# Patient Record
Sex: Female | Born: 1970
Health system: Southern US, Community
[De-identification: ages and names within clinical notes are randomized; demographics above are authoritative.]

## PROBLEM LIST (undated history)

## (undated) DIAGNOSIS — M069 Rheumatoid arthritis, unspecified: Secondary | ICD-10-CM

## (undated) HISTORY — DX: Rheumatoid arthritis, unspecified: M06.9

## (undated) HISTORY — PX: OTHER SURGICAL HISTORY: SHX169

---

## 2015-09-29 ENCOUNTER — Other Ambulatory Visit: Payer: Self-pay | Admitting: Internal Medicine

## 2015-09-29 DIAGNOSIS — Z1239 Encounter for other screening for malignant neoplasm of breast: Secondary | ICD-10-CM

## 2015-10-15 ENCOUNTER — Ambulatory Visit
Admission: RE | Admit: 2015-10-15 | Discharge: 2015-10-15 | Disposition: A | Discharge status: Home / Self Care | Payer: BLUE CROSS/BLUE SHIELD | Source: Ambulatory Visit | Attending: Internal Medicine | Admitting: Internal Medicine

## 2015-10-15 DIAGNOSIS — Z1239 Encounter for other screening for malignant neoplasm of breast: Secondary | ICD-10-CM

## 2015-10-15 DIAGNOSIS — Z1231 Encounter for screening mammogram for malignant neoplasm of breast: Secondary | ICD-10-CM | POA: Insufficient documentation

## 2018-03-22 ENCOUNTER — Other Ambulatory Visit (INDEPENDENT_AMBULATORY_CARE_PROVIDER_SITE_OTHER): Payer: BLUE CROSS/BLUE SHIELD

## 2018-03-22 ENCOUNTER — Ambulatory Visit (INDEPENDENT_AMBULATORY_CARE_PROVIDER_SITE_OTHER): Payer: BLUE CROSS/BLUE SHIELD | Admitting: Obstetrics and Gynecology

## 2018-03-22 ENCOUNTER — Encounter: Payer: Self-pay | Admitting: Obstetrics and Gynecology

## 2018-03-22 VITALS — BP 118/76 | HR 79 | Ht 64.0 in | Wt 137.5 lb

## 2018-03-22 DIAGNOSIS — Z3A01 Less than 8 weeks gestation of pregnancy: Secondary | ICD-10-CM | POA: Diagnosis not present

## 2018-03-22 DIAGNOSIS — Z3201 Encounter for pregnancy test, result positive: Secondary | ICD-10-CM

## 2018-03-22 DIAGNOSIS — N8312 Corpus luteum cyst of left ovary: Secondary | ICD-10-CM

## 2018-03-22 DIAGNOSIS — O200 Threatened abortion: Secondary | ICD-10-CM

## 2018-03-22 DIAGNOSIS — O3411 Maternal care for benign tumor of corpus uteri, first trimester: Secondary | ICD-10-CM | POA: Diagnosis not present

## 2018-03-22 DIAGNOSIS — O209 Hemorrhage in early pregnancy, unspecified: Secondary | ICD-10-CM | POA: Diagnosis not present

## 2018-03-22 DIAGNOSIS — O09521 Supervision of elderly multigravida, first trimester: Secondary | ICD-10-CM

## 2018-03-22 LAB — POCT URINALYSIS DIPSTICK
Bilirubin, UA: NEGATIVE
Glucose, UA: NEGATIVE
KETONES UA: NEGATIVE
LEUKOCYTES UA: NEGATIVE
NITRITE UA: NEGATIVE
PH UA: 6.5 (ref 5.0–8.0)
PROTEIN UA: POSITIVE — AB
Spec Grav, UA: 1.015 (ref 1.010–1.025)
UROBILINOGEN UA: 0.2 U/dL

## 2018-03-22 LAB — POCT URINE PREGNANCY: PREG TEST UR: POSITIVE — AB

## 2018-03-22 NOTE — Patient Instructions (Signed)
Threatened Miscarriage °A threatened miscarriage occurs when you have vaginal bleeding during your first 20 weeks of pregnancy but the pregnancy has not ended. If you have vaginal bleeding during this time, your health care provider will do tests to make sure you are still pregnant. If the tests show you are still pregnant and the developing baby (fetus) inside your womb (uterus) is still growing, your condition is considered a threatened miscarriage. °A threatened miscarriage does not mean your pregnancy will end, but it does increase the risk of losing your pregnancy (complete miscarriage). °What are the causes? °The cause of a threatened miscarriage is usually not known. If you go on to have a complete miscarriage, the most common cause is an abnormal number of chromosomes in the developing baby. Chromosomes are the structures inside cells that hold all your genetic material. °Some causes of vaginal bleeding that do not result in miscarriage include: °· Having sex. °· Having an infection. °· Normal hormone changes of pregnancy. °· Bleeding that occurs when an egg implants in your uterus. ° °What increases the risk? °Risk factors for bleeding in early pregnancy include: °· Obesity. °· Smoking. °· Drinking excessive amounts of alcohol or caffeine. °· Recreational drug use. ° °What are the signs or symptoms? °· Light vaginal bleeding. °· Mild abdominal pain or cramps. °How is this diagnosed? °If you have bleeding with or without abdominal pain before 20 weeks of pregnancy, your health care provider will do tests to check whether you are still pregnant. One important test involves using sound waves and a computer (ultrasound) to create images of the inside of your uterus. Other tests include an internal exam of your vagina and uterus (pelvic exam) and measurement of your baby’s heart rate. °You may be diagnosed with a threatened miscarriage if: °· Ultrasound testing shows you are still pregnant. °· Your baby’s heart  rate is strong. °· A pelvic exam shows that the opening between your uterus and your vagina (cervix) is closed. °· Your heart rate and blood pressure are stable. °· Blood tests confirm you are still pregnant. ° °How is this treated? °No treatments have been shown to prevent a threatened miscarriage from going on to a complete miscarriage. However, the right home care is important. °Follow these instructions at home: °· Make sure you keep all your appointments for prenatal care. This is very important. °· Get plenty of rest. °· Do not have sex or use tampons if you have vaginal bleeding. °· Do not douche. °· Do not smoke or use recreational drugs. °· Do not drink alcohol. °· Avoid caffeine. °Contact a health care provider if: °· You have light vaginal bleeding or spotting while pregnant. °· You have abdominal pain or cramping. °· You have a fever. °Get help right away if: °· You have heavy vaginal bleeding. °· You have blood clots coming from your vagina. °· You have severe low back pain or abdominal cramps. °· You have fever, chills, and severe abdominal pain. °This information is not intended to replace advice given to you by your health care provider. Make sure you discuss any questions you have with your health care provider. °Document Released: 07/26/2005 Document Revised: 01/01/2016 Document Reviewed: 05/22/2013 °Elsevier Interactive Patient Education © 2018 Elsevier Inc. ° °

## 2018-03-22 NOTE — Progress Notes (Signed)
Pt is present today due to bleeding in early pregnancy. Pt's LMP was 02/11/18 and she has been bleeding heavily for 2 weeks. UPT done in office today and it was positive.

## 2018-03-22 NOTE — Progress Notes (Addendum)
GYNECOLOGY CLINIC PROGRESS NO TE Subjective:    Amber Austin is a [age] y.o. 452P0010 [place] female who presents today with h/o recent positive home pregnancy test and complaints of vaginal bleeding intermittent x 2 weeks. She reports an episode of light spotting ~ 2 weeks ago, with another episode of heavier bleeding last night. She denies passage of tissue. She is not in acute distress. Ectopic risks: none. Patient's last menstrual period was 02/11/2018.  She notes her cycles are regular.    History reviewed. No pertinent past medical history.  OB History  Gravida Para Term Preterm AB Living  2       1    SAB TAB Ectopic Multiple Live Births  1            # Outcome Date GA Lbr Len/2nd Weight Sex Delivery Anes PTL Lv  2 Current           1 SAB 2013            Past Surgical History:  Procedure Laterality Date  . gallstone      Family History  Problem Relation Age of Onset  . Diabetes Mother   . Breast cancer Neg Hx     Social History   Socioeconomic History  . Marital status: Married    Spouse name: Not on file  . Number of children: Not on file  . Years of education: Not on file  . Highest education level: Not on file  Occupational History  . Not on file  Social Needs  . Financial resource strain: Not on file  . Food insecurity:    Worry: Not on file    Inability: Not on file  . Transportation needs:    Medical: Not on file    Non-medical: Not on file  Tobacco Use  . Smoking status: Never Smoker  . Smokeless tobacco: Never Used  Substance and Sexual Activity  . Alcohol use: Never    Frequency: Never  . Drug use: Never  . Sexual activity: Yes    Birth control/protection: None  Lifestyle  . Physical activity:    Days per week: Not on file    Minutes per session: Not on file  . Stress: Not on file  Relationships  . Social connections:    Talks on phone: Not on file    Gets together: Not on file    Attends religious service: Not on file    Active  member of club or organization: Not on file    Attends meetings of clubs or organizations: Not on file    Relationship status: Not on file  . Intimate partner violence:    Fear of current or ex partner: Not on file    Emotionally abused: Not on file    Physically abused: Not on file    Forced sexual activity: Not on file  Other Topics Concern  . Not on file  Social History Narrative  . Not on file    Current Outpatient Medications on File Prior to Visit  Medication Sig Dispense Refill  . Prenatal Vit-Fe Fumarate-FA (PRENATAL MULTIVITAMIN) TABS tablet Take 1 tablet by mouth daily at 12 noon.     No current facility-administered medications on file prior to visit.     No Known Allergies    Review of Systems A comprehensive review of systems was negative except for: Genitourinary: positive for mild intermittent LLQ pain, however notes that this pain has been present since prior to recent pregnancy  and what's noted in HPI  Objective:     BP 118/76   Pulse 79   Ht 5\' 4"  (1.626 m)   Wt 137 lb 8 oz (62.4 kg)   LMP 02/11/2018   BMI 23.60 kg/m  General:   alert and no distress  Abdomen: soft, non-tender, without masses or organomegaly  Pelvic: External genitalia: normal general appearance Vaginal: normal mucosa without prolapse or lesions and scant dark brown blood in vaginal vault Cervix: normal appearance and closed Adnexa: normal bimanual exam Uterus: normal single, nontender, ~ 6 week sized.                  Extremities:  extremities normal, atraumatic, no cyanosis or edema                  Neurologic: Grossly normal   Imaging: US OB Transvaginal ULTRASOUND REPORT  Location: [facility] Date of Service:  03/22/2018  Indications: Bleeding in early pregnancy Findings:  A single gestational sac is identified within the fundus of the uterus  measuring approximately 5 5/[redacted] weeks gestation giving an EDD of 11/17/18. The (U/S) EDD is consistent with the  clinically established (LMP) EDD of  11/18/18.  A normal appearing yolk sac is identified within the gestational sac. A fetal pole is not identified at this time (early pregnancy?).  Right Ovary measures 3.1 x 2.0 x 1.8 cm. It is normal in appearance. Left Ovary measures 2.8 x 2.2 x 1.8 cm. It is normal appearance. There is evidence of a corpus luteal cyst in the left ovary. A small amount of free fluid is seen adjacent to each ovary. Survey of the adnexa demonstrates no adnexal masses. There is a small amount of free peritoneal fluid in the cul de sac.  Impression: 1. 5 5/[redacted] week gestational sac identified.  A fetal pole is not identified  at this time. 2. (U/S) EDD is consistent with Clinically established (LMP) EDD of  11/18/18. 3. Bilateral ovaries appear WNL.  Left ovary contains a corpus luteal  cyst. 4. Small amount of free fluid adjacent to each ovary. 5. Small amount of free fluid noted in the cul de sac.  Recommendations: 1.Clinical correlation with the patient's History and Physical Exam. 2. F/U U/S in 2 weeks to reassess viability.  Amber Austin, RDMS  I have reviewed this study and agree with documented findings.   Amber LaserAnika Quillan Whitter, MD [facility]    Assessment:    IUP at 5.[redacted] weeks gestation Threatened abortion  Advanced maternal age  Plan:   UPT positive in office today, performed prior to ultrasound.  Blood type and Rh: pending. Will administer Rhogam if indicated. Quantitative hCG today and again in 48 hours. Transvaginal ultrasound to be repeated in 2 weeks per recommendations.  Bleeding precautions discussed.    Amber Austin, Amber Sforza, MD [facility]

## 2018-03-23 ENCOUNTER — Encounter: Payer: Self-pay | Admitting: Obstetrics and Gynecology

## 2018-03-23 ENCOUNTER — Other Ambulatory Visit: Payer: Self-pay | Admitting: Obstetrics and Gynecology

## 2018-03-23 DIAGNOSIS — O200 Threatened abortion: Secondary | ICD-10-CM

## 2018-03-23 LAB — ABO AND RH: Rh Factor: POSITIVE

## 2018-03-23 LAB — BETA HCG QUANT (REF LAB): HCG QUANT: 8355 m[IU]/mL

## 2018-03-24 ENCOUNTER — Other Ambulatory Visit: Payer: BLUE CROSS/BLUE SHIELD

## 2018-03-24 DIAGNOSIS — O200 Threatened abortion: Secondary | ICD-10-CM

## 2018-03-25 LAB — BETA HCG QUANT (REF LAB): hCG Quant: 12240 m[IU]/mL

## 2018-04-01 ENCOUNTER — Other Ambulatory Visit: Payer: Self-pay

## 2018-04-01 ENCOUNTER — Emergency Department: Payer: BLUE CROSS/BLUE SHIELD

## 2018-04-01 ENCOUNTER — Encounter: Payer: Self-pay | Admitting: Emergency Medicine

## 2018-04-01 ENCOUNTER — Emergency Department
Admission: EM | Admit: 2018-04-01 | Discharge: 2018-04-01 | Disposition: A | Discharge status: Home / Self Care | Payer: BLUE CROSS/BLUE SHIELD | Attending: Emergency Medicine | Admitting: Emergency Medicine

## 2018-04-01 DIAGNOSIS — O208 Other hemorrhage in early pregnancy: Secondary | ICD-10-CM | POA: Diagnosis not present

## 2018-04-01 DIAGNOSIS — O200 Threatened abortion: Secondary | ICD-10-CM | POA: Diagnosis not present

## 2018-04-01 DIAGNOSIS — Z79899 Other long term (current) drug therapy: Secondary | ICD-10-CM | POA: Diagnosis not present

## 2018-04-01 DIAGNOSIS — Z3A01 Less than 8 weeks gestation of pregnancy: Secondary | ICD-10-CM | POA: Insufficient documentation

## 2018-04-01 DIAGNOSIS — O209 Hemorrhage in early pregnancy, unspecified: Secondary | ICD-10-CM | POA: Diagnosis not present

## 2018-04-01 LAB — CBC WITH DIFFERENTIAL/PLATELET
BASOS ABS: 0 10*3/uL (ref 0–0.1)
BASOS PCT: 0 %
EOS ABS: 0 10*3/uL (ref 0–0.7)
EOS PCT: 0 %
HCT: 38.2 % (ref 35.0–47.0)
Hemoglobin: 13.1 g/dL (ref 12.0–16.0)
Lymphocytes Relative: 16 %
Lymphs Abs: 1.6 10*3/uL (ref 1.0–3.6)
MCH: 30 pg (ref 26.0–34.0)
MCHC: 34.4 g/dL (ref 32.0–36.0)
MCV: 87.3 fL (ref 80.0–100.0)
MONO ABS: 0.7 10*3/uL (ref 0.2–0.9)
Monocytes Relative: 7 %
Neutro Abs: 7.7 10*3/uL — ABNORMAL HIGH (ref 1.4–6.5)
Neutrophils Relative %: 77 %
PLATELETS: 172 10*3/uL (ref 150–440)
RBC: 4.37 MIL/uL (ref 3.80–5.20)
RDW: 13.3 % (ref 11.5–14.5)
WBC: 10.1 10*3/uL (ref 3.6–11.0)

## 2018-04-01 LAB — ABO/RH: ABO/RH(D): B POS

## 2018-04-01 LAB — HCG, QUANTITATIVE, PREGNANCY: HCG, BETA CHAIN, QUANT, S: 41709 m[IU]/mL — AB (ref <5)

## 2018-04-01 NOTE — ED Triage Notes (Signed)
States is about 6 or [redacted] weeks pregnant and noted lower abdominal pain and vaginal bleeding this am. August 14th had some painless vaginal bleeding which resolved in one day. States she has had an ultrasound but it was too early to see pregnancy.

## 2018-04-01 NOTE — ED Triage Notes (Signed)
Patient is able to give detailed history in triage in [place]. Does not require translator at this time. Primary language [place]. Let know translator will be available inside when she sees provider if she wishes to use that at that time.

## 2018-04-01 NOTE — ED Notes (Signed)
Called lab to add on hCG and CBC with diff. They stated they would add on those labs to tubes in lab already.

## 2018-04-01 NOTE — ED Notes (Signed)
Pt states she began having vaginal bleeding today at 10AM. States has had previous miscarriage. C/o low abd pain and low back pain as well. Denies going through a pad. States some clots.  Alert, oriented, ambulatory. Threatening to leave if doesn't see the MD soon. Informed that MD's are in shift change at this time and MD will be by to assess her after shift change.

## 2018-04-01 NOTE — ED Provider Notes (Signed)
[facility] Emergency Department Provider Note  Time seen: 3:37 PM  I have reviewed the triage vital signs and the nursing notes.   HISTORY  Chief Complaint Vaginal Bleeding    HPI Amber Austin is a 47 y.o. female G3P0 A1 presents to the emergency department for vaginal bleeding and pregnancy.  According to the patient she is approximately [redacted] weeks pregnant by ultrasound which was obtained approximately 1 to 2 weeks ago.  Patient states this morning she developed lower abdominal cramping and has had mild spotting.  Denies any heavy bleeding.  Denies significant pain states it feels like menstrual cramping.  Largely negative review of systems otherwise.   History reviewed. No pertinent past medical history.  There are no active problems to display for this patient.   Past Surgical History:  Procedure Laterality Date  . gallstone      Prior to Admission medications   Medication Sig Start Date End Date Taking? Authorizing Provider  Prenatal Vit-Fe Fumarate-FA (PRENATAL MULTIVITAMIN) TABS tablet Take 1 tablet by mouth daily at 12 noon.    [provider]    No Known Allergies  Family History  Problem Relation Age of Onset  . Diabetes Mother   . Breast cancer Neg Hx     Social History Social History   Tobacco Use  . Smoking status: Never Smoker  . Smokeless tobacco: Never Used  Substance Use Topics  . Alcohol use: Never    Frequency: Never  . Drug use: Never    Review of Systems Constitutional: Negative for fever Cardiovascular: Negative for chest pain. Respiratory: Negative for shortness of breath. Gastrointestinal: Lower abdominal cramping.  No vomiting or diarrhea. Genitourinary: Negative for urinary compaints.  Positive for vaginal spotting. Musculoskeletal: Negative for musculoskeletal complaints Skin: Negative for skin complaints  Neurological: Negative for headache All other ROS  negative  ____________________________________________   PHYSICAL EXAM:  VITAL SIGNS: ED Triage Vitals  Enc Vitals Group     BP 04/01/18 1121 127/72     Pulse Rate 04/01/18 1121 76     Resp 04/01/18 1121 14     Temp 04/01/18 1121 98.6 F (37 C)     Temp Source 04/01/18 1121 Oral     SpO2 04/01/18 1121 100 %     Weight 04/01/18 1138 138 lb (62.6 kg)     Height 04/01/18 1138 5\' 4"  (1.626 m)     Head Circumference --      Peak Flow --      Pain Score 04/01/18 1138 5     Pain Loc --      Pain Edu? --      Excl. in GC? --    Constitutional: Alert and oriented. Well appearing and in no distress. Eyes: Normal exam ENT   Head: Normocephalic and atraumatic.   Mouth/Throat: Mucous membranes are moist. Cardiovascular: Normal rate, regular rhythm. No murmur Respiratory: Normal respiratory effort without tachypnea nor retractions. Breath sounds are clear Gastrointestinal: Soft, mild suprapubic tenderness abdomen otherwise benign without rebound guarding or distention. Musculoskeletal: Nontender with normal range of motion in all extremities.  Neurologic:  Normal speech and language. No gross focal neurologic deficits Skin:  Skin is warm, dry and intact.  Psychiatric: Mood and affect are normal.  ____________________________________________    RADIOLOGY  Ultrasound shows IUP with a very low heart rate  ____________________________________________   INITIAL IMPRESSION / ASSESSMENT AND PLAN / ED COURSE  Pertinent labs & imaging results that were available during my care  of the patient were reviewed by me and considered in my medical decision making (see chart for details).  Patient presents to the emergency department for lower abdominal cramping with vaginal spotting approximately [redacted] weeks pregnant.  I reviewed the patient's records she last had an ultrasound done approximately 10 days ago which time a solid gestational sac but no fetal pole.  Patient has a be positive  blood type, awaiting quantitative hCG will obtain an ultrasound and continue to closely monitor.  Overall the patient appears well hemodynamically stable with reassuring vitals, CBC is reassuring as well.  Differential this time would include miscarriage, threatened miscarriage, subchorionic hemorrhage, cervical bleeding.  Sound shows IUP with a low heart rate.  I discussed with the patient and family follow-up with her OB in 2 weeks for repeat ultrasound also discussed return precautions/OB return precautions for worsening bleeding.  Patient agreeable plan of care.  ____________________________________________   FINAL CLINICAL IMPRESSION(S) / ED DIAGNOSES  Threatened miscarriage    Minna Antis, MD 04/01/18 [phone]

## 2018-04-01 NOTE — Discharge Instructions (Addendum)
As we discussed please follow-up with OB, call on Monday to inform them of today's ER visit.  We recommend repeating an ultrasound in approximately 2 weeks.  Please follow-up with your OB sooner if you have heavy bleeding, or return to the emergency department.

## 2018-04-04 ENCOUNTER — Other Ambulatory Visit: Payer: Self-pay | Admitting: Obstetrics and Gynecology

## 2018-04-04 ENCOUNTER — Ambulatory Visit (INDEPENDENT_AMBULATORY_CARE_PROVIDER_SITE_OTHER): Payer: BLUE CROSS/BLUE SHIELD

## 2018-04-04 DIAGNOSIS — O3411 Maternal care for benign tumor of corpus uteri, first trimester: Secondary | ICD-10-CM | POA: Diagnosis not present

## 2018-04-04 DIAGNOSIS — O200 Threatened abortion: Secondary | ICD-10-CM | POA: Diagnosis not present

## 2018-04-04 DIAGNOSIS — N8312 Corpus luteum cyst of left ovary: Secondary | ICD-10-CM

## 2018-04-04 DIAGNOSIS — O209 Hemorrhage in early pregnancy, unspecified: Secondary | ICD-10-CM

## 2018-04-11 ENCOUNTER — Ambulatory Visit (INDEPENDENT_AMBULATORY_CARE_PROVIDER_SITE_OTHER): Payer: BLUE CROSS/BLUE SHIELD

## 2018-04-11 ENCOUNTER — Other Ambulatory Visit: Payer: Self-pay | Admitting: Obstetrics and Gynecology

## 2018-04-11 DIAGNOSIS — O3680X0 Pregnancy with inconclusive fetal viability, not applicable or unspecified: Secondary | ICD-10-CM

## 2018-04-11 DIAGNOSIS — N8311 Corpus luteum cyst of right ovary: Secondary | ICD-10-CM

## 2018-04-11 DIAGNOSIS — O3411 Maternal care for benign tumor of corpus uteri, first trimester: Secondary | ICD-10-CM

## 2018-04-11 DIAGNOSIS — Z3A01 Less than 8 weeks gestation of pregnancy: Secondary | ICD-10-CM | POA: Diagnosis not present

## 2018-04-11 DIAGNOSIS — O209 Hemorrhage in early pregnancy, unspecified: Secondary | ICD-10-CM

## 2018-04-11 DIAGNOSIS — O368390 Maternal care for abnormalities of the fetal heart rate or rhythm, unspecified trimester, not applicable or unspecified: Secondary | ICD-10-CM

## 2018-04-19 ENCOUNTER — Other Ambulatory Visit: Payer: Self-pay | Admitting: Obstetrics and Gynecology

## 2018-04-19 ENCOUNTER — Telehealth: Payer: Self-pay | Admitting: Obstetrics and Gynecology

## 2018-04-19 ENCOUNTER — Ambulatory Visit (INDEPENDENT_AMBULATORY_CARE_PROVIDER_SITE_OTHER): Payer: BLUE CROSS/BLUE SHIELD

## 2018-04-19 ENCOUNTER — Ambulatory Visit (INDEPENDENT_AMBULATORY_CARE_PROVIDER_SITE_OTHER): Payer: BLUE CROSS/BLUE SHIELD | Admitting: Obstetrics and Gynecology

## 2018-04-19 DIAGNOSIS — Z3A01 Less than 8 weeks gestation of pregnancy: Secondary | ICD-10-CM

## 2018-04-19 DIAGNOSIS — O021 Missed abortion: Secondary | ICD-10-CM

## 2018-04-19 DIAGNOSIS — O09521 Supervision of elderly multigravida, first trimester: Secondary | ICD-10-CM

## 2018-04-19 DIAGNOSIS — O039 Complete or unspecified spontaneous abortion without complication: Secondary | ICD-10-CM

## 2018-04-19 DIAGNOSIS — O034 Incomplete spontaneous abortion without complication: Secondary | ICD-10-CM

## 2018-04-19 DIAGNOSIS — O3411 Maternal care for benign tumor of corpus uteri, first trimester: Secondary | ICD-10-CM | POA: Diagnosis not present

## 2018-04-19 DIAGNOSIS — N8312 Corpus luteum cyst of left ovary: Secondary | ICD-10-CM

## 2018-04-19 DIAGNOSIS — Z8759 Personal history of other complications of pregnancy, childbirth and the puerperium: Secondary | ICD-10-CM

## 2018-04-19 MED ORDER — IBUPROFEN 800 MG PO TABS: 800.0000 mg | ORAL_TABLET | Freq: Three times a day (TID) | ORAL | 1 refills | Status: DC | PRN

## 2018-04-19 NOTE — Telephone Encounter (Signed)
Pt is coming in for an u/s and follow up with [facility] after the scan.

## 2018-04-19 NOTE — Telephone Encounter (Signed)
The patients husband called and stated that the patient is experiencing heavy bleeding, lower abdomin pain. The patient would like to speak with a nurse as soon as possible due to her level of discomfort. Please advise.

## 2018-04-19 NOTE — Progress Notes (Addendum)
    GYNECOLOGY PROGRESS NOTE  Subjective:    Patient ID: Amber Austin, female    DOB: [birth year], 47 y.o.   MRN: [id number]  HPI  Patient is a 47 y.o. G74P0010 female who presents for f/u after ultrasound for vaginal bleeding in pregnancy.  Patient has had several episodes of vaginal bleeding this pregnancy.  Has been diagnosed with threatened abortion.  Last visit was ~ 1 week ago.  Since that time, patient states that she had not had any more bleeding until today.  Notes that the bleeding became heavier over the past few hours.  She is also having some mild cramping. Denied dizziness, headaches.   The following portions of the patient's history were reviewed and updated as appropriate: allergies, current medications, past family history, past medical history, past social history, past surgical history and problem list.  Review of Systems Pertinent items noted in HPI and remainder of comprehensive ROS otherwise negative.   Objective:   Last menstrual period 02/11/2018. General appearance: alert and no distress Remainder of exam deferred.    ULTRASOUND IMAGING: Location: [facility] Date of Service:  04/19/2018  Indications: Viability; Bleeding Findings:  An irregular gestational sac is identified within the LUS.   A probable fetal pole is identified within, measuring approximately 5 6/[redacted] weeks gestation with no cardiac activity. Thickened endometrium with echogenic material (blood) within is noted in the fundal end of the uterus.  FHR: No fetal heart tones CRL measurement: 2.6 mm Yolk sac is not visualized on today's scan.  Right Ovary measures 2.7 x 1.6 x 1.2 cm. It is normal in appearance. Left Ovary measures 2.5 x 2.5 x 1.6 cm. It is normal appearance. There is evidence of a corpus luteal cyst in the left ovary. Survey of the adnexa demonstrates no adnexal masses. There is no free peritoneal fluid in the cul de sac.  Impression: 1. Active miscarriage. 2.  Irregular gestational sac identified within the LUS. 3. Probable fetal pole identified measuring 5 6/[redacted] weeks gestation with no cardiac activity. 4. Notified Dr. Valentino Saxon  Recommendations: 1.Clinical correlation with the patient's History and Physical Exam.  Kari Baars, RDMS   I have reviewed this study and agree with documented findings.    Hildred Laser, MD [facility]  Assessment:   Inevitable abortion 47 AYA status History of miscarriage  Plan:   Discussion had with patient and her husband regarding inevitable abortion.  Patient and husband question patient's continued risk for miscarriages based on her age, and if they should consider trying again.  Discussed that she would likely need some pre-pregnancy evaluation performed as this was her second miscarriage and due to her age. Husband states that he has a 77 year old from a previous marriage. Offered Anora POC testing, but patient declined.  Given SAB precautions.  Given prescription for Ibuprofen for cramping.  HCG ordered today. Advised to return to office in 1 week for f/u ultrasound and BHCG.  Can discuss pre-pregnancy counseling more at next visit.    Hildred Laser, MD [facility]

## 2018-04-20 LAB — BETA HCG QUANT (REF LAB): HCG QUANT: 15865 m[IU]/mL

## 2018-04-25 ENCOUNTER — Other Ambulatory Visit: Payer: BLUE CROSS/BLUE SHIELD

## 2018-04-26 ENCOUNTER — Ambulatory Visit (INDEPENDENT_AMBULATORY_CARE_PROVIDER_SITE_OTHER): Payer: BLUE CROSS/BLUE SHIELD

## 2018-04-26 ENCOUNTER — Encounter: Payer: Self-pay | Admitting: Obstetrics and Gynecology

## 2018-04-26 ENCOUNTER — Ambulatory Visit (INDEPENDENT_AMBULATORY_CARE_PROVIDER_SITE_OTHER): Payer: BLUE CROSS/BLUE SHIELD | Admitting: Obstetrics and Gynecology

## 2018-04-26 VITALS — BP 139/80 | HR 77 | Ht 64.0 in | Wt 137.3 lb

## 2018-04-26 DIAGNOSIS — N96 Recurrent pregnancy loss: Secondary | ICD-10-CM

## 2018-04-26 DIAGNOSIS — O09529 Supervision of elderly multigravida, unspecified trimester: Secondary | ICD-10-CM

## 2018-04-26 DIAGNOSIS — O039 Complete or unspecified spontaneous abortion without complication: Secondary | ICD-10-CM | POA: Diagnosis not present

## 2018-04-26 DIAGNOSIS — O034 Incomplete spontaneous abortion without complication: Secondary | ICD-10-CM

## 2018-04-26 NOTE — Patient Instructions (Signed)
Recurrent Pregnancy Loss Recurrent pregnancy loss is the loss of three or more pregnancies before 20 weeks of gestation. Losing three or more pregnancies in a row is rare. What are the causes? The most common cause of recurrent pregnancy loss is an abnormal number of chromosomes in the developing baby (fetus). Chromosomes are the structures inside cells that hold all your genetic material. In most cases of recurrent pregnancy loss, a missing or extra chromosome keeps a baby from developing. It may not be possible to identify which chromosome is defective. Chromosome abnormalities can be inherited, but most of the time they occur by chance. Other possible causes of recurrent pregnancy loss include:  Being born with an abnormal womb structure (septate uterus).  Having noncancerous growths in your uterus (fibroids or polyps).  Having a disease that causes scarring in your uterus (Asherman syndrome).  Having a disease that causes your blood to clot (antiphospholipid syndrome).  Having a disease that increases bleeding (thrombophilia).  What increases the risk? The risk of recurrent pregnancy loss increases as your age increases. Other risk factors include:  Diabetes.  Thyroid disease.  Obesity.  Smoking.  Excessive alcohol or caffeine.  Recreational drug use.  What are the signs or symptoms?  Vaginal bleeding.  Passing clots vaginally.  Abdominal pain or cramps.  Low back pain. How is this diagnosed? Your health care provider can create an image of your womb using sound waves and a computer (ultrasound) to confirm your pregnancy by 5 or 6 weeks after you conceive. Ultrasound can also confirm a pregnancy loss. Your health care provider may do a complete physical exam, including your vagina and uterus (pelvic exam), to find possible causes of recurrent pregnancy loss. Other tests may include:  Ultrasound imaging to see if the structure of your uterus is normal or if you have  polyps or fibroids.  Blood tests to see if you have a disease that causes recurrent pregnancy loss.  Blood tests to see if your blood clots normally.  Genetic testing of you and your partner.  How is this treated? In many cases, there is no specific treatment. Depending on the cause, possible treatments include:  Having your eggs fertilized outside your uterus (in vitro fertilization). By doing this, a health care provider may be able to select eggs without chromosome abnormalities.  Taking a blood thinner to prevent clotting if you have antiphospholipid syndrome.  Having corrective surgery if you have an abnormality in your uterus.  Follow these instructions at home: Follow all your health care provider's home instructions carefully. These may include:  Do not smoke or use recreational drugs.  Do not drink alcohol if you are pregnant.  Do not put anything in your vagina or have sex for 2 weeks after you have had a miscarriage.  If you are Rh negative and have had a miscarriage, you may need to get a shot of Rho (D) immune globulin. Ask your doctor if you need this shot.  Use birth control if you do not want to get pregnant. You can get pregnant [redacted] weeks after a miscarriage.  Get support from friends and loved ones. Miscarriage can be a sad and stressful event.  Contact a health care provider if:  You have light vaginal bleeding or spotting while pregnant.  You have been trying to get pregnant without success.  You are struggling with sadness or depression after a miscarriage. Get help right away if:  You have heavy vaginal bleeding and abdominal cramps.  You   have fever, chills, and severe abdominal pain. This information is not intended to replace advice given to you by your health care provider. Make sure you discuss any questions you have with your health care provider. Document Released: 01/12/2008 Document Revised: 01/01/2016 Document Reviewed: 05/18/2013 Elsevier  Interactive Patient Education  2018 Elsevier Inc.  

## 2018-04-26 NOTE — Progress Notes (Signed)
Pt is present today for a follow up after her ultrasound. Pt stated that she was doing well no complaints.

## 2018-04-26 NOTE — Progress Notes (Signed)
GYNECOLOGY PROGRESS NOTE  Subjective:    Patient ID: Amber Austin, female    DOB: [birth year], 47 y.o.   MRN: [id number]  HPI  Patient is a 47 y.o. G18P0010 female who presents for one-week follow-up status post spontaneous miscarriage.  She reports that her bleeding has slowed significantly.  She denies any pelvic cramping.   The following portions of the patient's history were reviewed and updated as appropriate: allergies, current medications, past family history, past medical history, past social history, past surgical history and problem list.  Review of Systems Pertinent items noted in HPI and remainder of comprehensive ROS otherwise negative.   Objective:   Blood pressure 139/80, pulse 77, height 5\' 4"  (1.626 m), weight 137 lb 4.8 oz (62.3 kg), last menstrual period 02/11/2018. General appearance: alert and no distress Abdomen: soft, non-tender; bowel sounds normal; no masses,  no organomegaly Pelvic: deferred   Imaging  US Transvaginal Non-OB ULTRASOUND REPORT  Location: [facility] Date of Service:  04/26/2018  Indications: S/P miscarriage Findings:  The uterus measures 7.9 x 6.6 x 4.3 cm. Echo texture is homogeneous without evidence of focal masses.  The Endometrium measures 9.3 mm.  Small amount of fluid and echogenic debris (old blood) remain in the  cervical canal.  No products of conception are visualized on today's scan.  Right Ovary measures 2.2 x 1.6 x 1.2 cm. It is normal in appearance. Left Ovary measures 2.2 x 1.6 x 1.7 cm. It is normal appearance. Survey of the adnexa demonstrates no adnexal masses. There is no free fluid in the cul de sac.  Impression: 1. Anteverted uterus appears of normal size and contour. 2. Endometrium appears slightly heterogeneous and measures 9.3 mm. 3. Small amount of fluid and echogenic debris (old blood) remain in the  cervical canal. 4. Bilateral ovaries appear WNL. 5. No products of conception are  visualized on today's scan.  Recommendations: 1.Clinical correlation with the patient's History and Physical Exam.  Kari Baars, RDMS  I have reviewed this study and agree with documented findings.   Hildred Laser, MD [facility]       Assessment:   Status post spontaneous miscarriage History of recurrent miscarriage Advanced maternal age  Plan:   -Reviewed patient's ultrasound today.  Based on today's ultrasound findings, it appears that patient's miscarriage is complete.  No evidence of retained products of conception noted on ultrasound today.  Will repeat beta hCG to ensure trending down of levels. -Recurrent miscarriages -patient now status post 2 spontaneous miscarriages.  Is concerned that based on her age that she may not be able to carry a pregnancy.  Advised that if she attempts future pregnancy, she will need to begin a daily baby aspirin of 81 mg dosing.  Also discussed that patient can return in approximately 3 weeks and can undergo work-up for recurrent pregnancy loss.  -Advanced maternal age -discussed with patient that age is a factor infertility, however she has been able to become pregnant spontaneously, however has not been able to maintain a pregnancy.  Again recommended taking daily baby aspirin, and once a pregnancy is confirmed by home UPT she will need to notify MD so that she can begin progesterone supplementation to help maintain the pregnancy.  She is also going to undergo further testing in the next several weeks to rule out any genetic problems.  I further discussed with patient and her husband regarding the option of in vitro fertilization, and given information on [facility] clinic.  A total of 15 minutes were spent face-to-face with the patient during this encounter and over half of that time dealt with counseling and coordination of care.   Hildred Laserherry, Jasiah Buntin, MD [facility]

## 2018-04-27 ENCOUNTER — Encounter: Payer: Self-pay | Admitting: Obstetrics and Gynecology

## 2018-04-27 LAB — CBC
HEMATOCRIT: 36.1 % (ref 34.0–46.6)
HEMOGLOBIN: 11.8 g/dL (ref 11.1–15.9)
MCH: 28.6 pg (ref 26.6–33.0)
MCHC: 32.7 g/dL (ref 31.5–35.7)
MCV: 87 fL (ref 79–97)
Platelets: 220 10*3/uL (ref 150–450)
RBC: 4.13 x10E6/uL (ref 3.77–5.28)
RDW: 13.5 % (ref 12.3–15.4)
WBC: 7.6 10*3/uL (ref 3.4–10.8)

## 2018-04-27 LAB — BETA HCG QUANT (REF LAB): HCG QUANT: 224 m[IU]/mL

## 2018-05-17 ENCOUNTER — Encounter: Payer: Self-pay | Admitting: Obstetrics and Gynecology

## 2018-05-17 ENCOUNTER — Ambulatory Visit (INDEPENDENT_AMBULATORY_CARE_PROVIDER_SITE_OTHER): Payer: BLUE CROSS/BLUE SHIELD | Admitting: Obstetrics and Gynecology

## 2018-05-17 VITALS — BP 120/79 | HR 82 | Ht 64.0 in | Wt 135.7 lb

## 2018-05-17 DIAGNOSIS — N96 Recurrent pregnancy loss: Secondary | ICD-10-CM

## 2018-05-17 DIAGNOSIS — O09529 Supervision of elderly multigravida, unspecified trimester: Secondary | ICD-10-CM | POA: Insufficient documentation

## 2018-05-17 NOTE — Progress Notes (Signed)
[place] interpreter # [phone] used. Pt is present today for a follow up from a miscarriage. Pt stated that she is still bleeding and not sure if its her cycle or still bleeding from miscarriage.

## 2018-05-17 NOTE — Patient Instructions (Signed)
Take 400 mcg of folic acid daily Begin taking prenatal vitamins daily Begin taking aspirin 81 mg daily     Recurrent Pregnancy Loss Recurrent pregnancy loss is the loss of three or more pregnancies before 20 weeks of gestation. Losing three or more pregnancies in a row is rare. What are the causes? The most common cause of recurrent pregnancy loss is an abnormal number of chromosomes in the developing baby (fetus). Chromosomes are the structures inside cells that hold all your genetic material. In most cases of recurrent pregnancy loss, a missing or extra chromosome keeps a baby from developing. It may not be possible to identify which chromosome is defective. Chromosome abnormalities can be inherited, but most of the time they occur by chance. Other possible causes of recurrent pregnancy loss include:  Being born with an abnormal womb structure (septate uterus).  Having noncancerous growths in your uterus (fibroids or polyps).  Having a disease that causes scarring in your uterus (Asherman syndrome).  Having a disease that causes your blood to clot (antiphospholipid syndrome).  Having a disease that increases bleeding (thrombophilia).  What increases the risk? The risk of recurrent pregnancy loss increases as your age increases. Other risk factors include:  Diabetes.  Thyroid disease.  Obesity.  Smoking.  Excessive alcohol or caffeine.  Recreational drug use.  What are the signs or symptoms?  Vaginal bleeding.  Passing clots vaginally.  Abdominal pain or cramps.  Low back pain. How is this diagnosed? Your health care provider can create an image of your womb using sound waves and a computer (ultrasound) to confirm your pregnancy by 5 or 6 weeks after you conceive. Ultrasound can also confirm a pregnancy loss. Your health care provider may do a complete physical exam, including your vagina and uterus (pelvic exam), to find possible causes of recurrent pregnancy  loss. Other tests may include:  Ultrasound imaging to see if the structure of your uterus is normal or if you have polyps or fibroids.  Blood tests to see if you have a disease that causes recurrent pregnancy loss.  Blood tests to see if your blood clots normally.  Genetic testing of you and your partner.  How is this treated? In many cases, there is no specific treatment. Depending on the cause, possible treatments include:  Having your eggs fertilized outside your uterus (in vitro fertilization). By doing this, a health care provider may be able to select eggs without chromosome abnormalities.  Taking a blood thinner to prevent clotting if you have antiphospholipid syndrome.  Having corrective surgery if you have an abnormality in your uterus.  Follow these instructions at home: Follow all your health care provider's home instructions carefully. These may include:  Do not smoke or use recreational drugs.  Do not drink alcohol if you are pregnant.  Do not put anything in your vagina or have sex for 2 weeks after you have had a miscarriage.  If you are Rh negative and have had a miscarriage, you may need to get a shot of Rho (D) immune globulin. Ask your doctor if you need this shot.  Use birth control if you do not want to get pregnant. You can get pregnant [redacted] weeks after a miscarriage.  Get support from friends and loved ones. Miscarriage can be a sad and stressful event.  Contact a health care provider if:  You have light vaginal bleeding or spotting while pregnant.  You have been trying to get pregnant without success.  You are struggling with  sadness or depression after a miscarriage. Get help right away if:  You have heavy vaginal bleeding and abdominal cramps.  You have fever, chills, and severe abdominal pain. This information is not intended to replace advice given to you by your health care provider. Make sure you discuss any questions you have with your health  care provider. Document Released: 01/12/2008 Document Revised: 01/01/2016 Document Reviewed: 05/18/2013 Elsevier Interactive Patient Education  2018 [employer].

## 2018-05-17 NOTE — Progress Notes (Signed)
    GYNECOLOGY PROGRESS NOTE  Subjective:    Patient ID: Amber Austin, female    DOB: 17-Jun-1971, 47 y.o.   MRN: [id number]  HPI  Patient is a 47 y.o. G5P0010 female who presents for 3-week follow-up after miscarriage.  This is the patient's second miscarriage.  Currently she notes that she has resumed bleeding.  She is unsure if this is the start of a new menstrual cycle or whether this is continued bleeding from her miscarriage.  She does note some mild cramping but otherwise denies any major complaints.  In addition, patient also is desiring further work-up to establish a potential reason for her recurrent miscarriages (has had 2 miscarriage).  She understands that she is of advanced maternal age but desires to know if there are any other factors involved, as she is able to become pregnant however is unable to maintain a pregnancy.    The following portions of the patient's history were reviewed and updated as appropriate: allergies, current medications, past family history, past medical history, past social history, past surgical history and problem list.  Review of Systems Pertinent items noted in HPI and remainder of comprehensive ROS otherwise negative.   Objective:   Blood pressure 120/79, pulse 82, height 5\' 4"  (1.626 m), weight 135 lb 11.2 oz (61.6 kg), last menstrual period 02/11/2018. General appearance: alert and no distress Abdomen: soft, non-tender; bowel sounds normal; no masses,  no organomegaly Pelvic: deferred Extremities: extremities normal, atraumatic, no cyanosis or edema Neurologic: Grossly normal   Assessment:   Recurrent miscarriages Advanced maternal age  Plan:   -Discussion had with patient regarding history of miscarriages and advanced maternal age.  Will perform work-up including assessment for thrombophilia, and assess ovarian reserve.  Labs ordered today.  Will also follow beta-hCG as last value approximately 2-3 weeks ago was 214. -Patient is  aware that she may require further reproductive assistance due to her advanced maternal age.  I also discussed that it would be beneficial for her to begin taking a daily prenatal vitamin, as well as daily baby aspirin, and to begin folic acid before trying to conceive again.  Once pregnant I discussed with patient that she may also require progesterone supplementation to maintain the pregnancy.  Patient had initially considered seeking in vitro fertilization treatments when she travels to [place] in [place] for the next several months, however notes that her husband is not able to travel with her.  When she returns, based on current labs, we can determine whether this would be feasible for them. -Patient to return in March 2020 when she returned from [place].   Hildred Laser, MD [facility]

## 2018-05-22 LAB — PROTEIN C AG + PROTEIN S AG
Protein C Antigen: 135 % (ref 60–150)
Protein S Ag, Free: 111 % (ref 57–157)
Protein S Ag, Total: 84 % (ref 60–150)

## 2018-05-22 LAB — CARDIOLIPIN ANTIBODIES, IGG, IGM, IGA

## 2018-05-22 LAB — LUPUS ANTICOAGULANT PANEL
Dilute Viper Venom Time: 29.6 s (ref 0.0–47.0)
PTT Lupus Anticoagulant: 40.8 s (ref 0.0–51.9)

## 2018-05-22 LAB — PROGESTERONE: Progesterone: 0.2 ng/mL

## 2018-05-22 LAB — BETA HCG QUANT (REF LAB): hCG Quant: 6 m[IU]/mL

## 2018-05-22 LAB — ANTI MULLERIAN HORMONE: ANTI-MULLERIAN HORMONE (AMH): 0.035 ng/mL

## 2018-05-22 LAB — TSH: TSH: 1.71 u[IU]/mL (ref 0.450–4.500)

## 2018-05-22 LAB — FACTOR 5 LEIDEN

## 2018-05-26 ENCOUNTER — Telehealth: Payer: Self-pay | Admitting: Obstetrics and Gynecology

## 2018-05-26 NOTE — Telephone Encounter (Signed)
The patient called today and asked if she could get her results from last week.  She was notified that her provider would be out of the office until 06/05/2018, but a message would be sent back to alert her nurse/provider that she is requesting a call to review the results upon her return, please advise, thanks.

## 2018-05-31 NOTE — Telephone Encounter (Signed)
Pt was called yesterday using [place] interpreter # [phone] who explained the pt's test results. Pt stated that she wanted to come in a see [facility] for infertility issues.

## 2018-06-08 ENCOUNTER — Other Ambulatory Visit: Payer: Self-pay

## 2018-06-12 ENCOUNTER — Other Ambulatory Visit: Payer: Self-pay

## 2018-06-13 ENCOUNTER — Other Ambulatory Visit: Payer: Self-pay

## 2018-06-19 ENCOUNTER — Other Ambulatory Visit: Payer: Self-pay

## 2018-06-19 ENCOUNTER — Encounter: Payer: Self-pay | Admitting: Obstetrics and Gynecology

## 2018-06-19 ENCOUNTER — Ambulatory Visit (INDEPENDENT_AMBULATORY_CARE_PROVIDER_SITE_OTHER): Payer: BLUE CROSS/BLUE SHIELD | Admitting: Obstetrics and Gynecology

## 2018-06-19 VITALS — BP 122/77 | HR 80 | Ht 64.0 in | Wt 142.8 lb

## 2018-06-19 DIAGNOSIS — Z8759 Personal history of other complications of pregnancy, childbirth and the puerperium: Secondary | ICD-10-CM

## 2018-06-19 DIAGNOSIS — O09529 Supervision of elderly multigravida, unspecified trimester: Secondary | ICD-10-CM

## 2018-06-19 MED ORDER — PROGESTERONE 8 % VA GEL: 1.0000 | Freq: Two times a day (BID) | VAGINAL | 4 refills | Status: DC

## 2018-06-19 NOTE — Telephone Encounter (Signed)
Pt called to remind the pt that her medication was available for pick up at [facility] in [place].

## 2018-06-19 NOTE — Progress Notes (Signed)
Pt is present today for infertility issues. Pt stated that she is doing well no complaints.

## 2018-06-19 NOTE — Progress Notes (Signed)
    GYNECOLOGY PROGRESS NOTE  Subjective:    Patient ID: Amber Austin, female    DOB: [birth year], 47 y.o.   MRN: [id number]  HPI  Patient is a 47 y.o. G15P0010 [place] female who presents for follow up of infertility. Patient has questions regarding medications prescribed. Notes that she tried to go to her pharmacy to pick up the prescription and it wasn't available.  Interpreter used for today's visit.   The following portions of the patient's history were reviewed and updated as appropriate: allergies, current medications, past family history, past medical history, past social history, past surgical history and problem list.  Review of Systems Pertinent items noted in HPI and remainder of comprehensive ROS otherwise negative.   Objective:   Blood pressure 122/77, pulse 80, height 5\' 4"  (1.626 m), weight 142 lb 12.8 oz (64.8 kg), last menstrual period 06/16/2018, unknown if currently breastfeeding. General appearance: alert and no distress  Assessment:    Plan:   - Lengthy discussion had on medications for helping with conception.  Patient notes that she is still not planning on conceiving until March. Discussed that if this is the case, she does not need to begin any other medications until then (was taking aspirin, prescribed progesterone gel which she has not started, and can begin on Clomid) - F/u in March to begin ovulation induction.    Hildred Laser, MD [facility]

## 2018-09-04 NOTE — Telephone Encounter (Signed)
0

## 2018-10-19 ENCOUNTER — Encounter: Payer: BLUE CROSS/BLUE SHIELD | Admitting: Obstetrics and Gynecology

## 2018-10-20 ENCOUNTER — Encounter: Payer: BLUE CROSS/BLUE SHIELD | Admitting: Obstetrics and Gynecology

## 2018-11-08 ENCOUNTER — Ambulatory Visit (INDEPENDENT_AMBULATORY_CARE_PROVIDER_SITE_OTHER): Payer: BLUE CROSS/BLUE SHIELD | Admitting: Obstetrics and Gynecology

## 2018-11-08 ENCOUNTER — Encounter: Payer: Self-pay | Admitting: Obstetrics and Gynecology

## 2018-11-08 ENCOUNTER — Other Ambulatory Visit: Payer: Self-pay

## 2018-11-08 VITALS — Ht 64.0 in | Wt 142.0 lb

## 2018-11-08 DIAGNOSIS — Z3200 Encounter for pregnancy test, result unknown: Secondary | ICD-10-CM

## 2018-11-08 DIAGNOSIS — Z319 Encounter for procreative management, unspecified: Secondary | ICD-10-CM

## 2018-11-08 NOTE — Progress Notes (Signed)
Telephone visit transferred routed from front desk. CC, vitals, medications and allergies reviewed/updated. Pt's visit is for infertility issues.

## 2018-11-08 NOTE — Addendum Note (Signed)
Addended by: Fabian November on: 11/08/2018 04:35 PM   Modules accepted: Level of Service

## 2018-11-08 NOTE — Progress Notes (Signed)
Virtual Visit via Telephone Note  I connected with [name] on 11/08/18 at  2:20 PM EDT by telephone and verified that I am speaking with the correct person using two identifiers.  A Language Line interpreter was used for today's encounter, Leontine Locket #[phone] as patient speaks [place].    I discussed the limitations, risks, security and privacy concerns of performing an evaluation and management service by telephone and the availability of in person appointments. I also discussed with the patient that there may be a patient responsible charge related to this service. The patient expressed understanding and agreed to proceed.   History of Present Illness: Amber Austin is a 48 y.o. G35P0020 female who calls the office to discuss plans for conception.  She noted that she desired to start trying again in March to conceive after returning from her trip, however notes that she may possibly already be pregnant.  States that her most recent menstrual cycle was irregular (very light, and only lasted for ~ 2 days, usually lasts 4-5 days).  LMP November 05, 2018.  She is also noting some breast fullness and nausea.  She has not yet taken a pregnancy test to confirm.    Observations/Objective: No vitals taken for today's visit as it was a telephone encounter. Patient's reported weight today was 65 kg.   Assessment and Plan: Patient was advised to take a pregnancy test.  Notes that she will get one in the next 2-3 days.  If positive, can call the office to schedule NOB intake and ultrasound for dating and viability in light of 2 prior miscarriages and advanced maternal age. Also encouraged that if pregnancy is confirmed, to continue taking her aspirin until 12 weeks of pregnancy as well as her progesterone, and to begin taking a daily folic acid supplement.   Follow Up Instructions:  I discussed the assessment and treatment plan with the patient. The patient was provided an opportunity to ask  questions and all were answered. The patient agreed with the plan and demonstrated an understanding of the instructions.     The patient was advised to call back or seek an in-person evaluation if the symptoms worsen or if the condition fails to improve as anticipated.  I provided 13 minutes of non-face-to-face time during this encounter.   Hildred Laser, MD  [facility]

## 2018-11-13 ENCOUNTER — Telehealth: Payer: Self-pay | Admitting: Obstetrics and Gynecology

## 2018-11-13 NOTE — Telephone Encounter (Signed)
The patient stated that she needs to schedule an appointment with Dr. Valentino Saxon in regards to some infertility issues, The patient stated she previously spoke with [facility], but was a little confused on what needed to be done. Please advise.

## 2018-11-13 NOTE — Telephone Encounter (Signed)
Do you want to see patient or schedule a visit in house?

## 2018-11-13 NOTE — Telephone Encounter (Signed)
She was supposed to be calling to let me know if her pregnancy test was positive or negative. If positive, she needs to be seen for confirmation.  If negative, we can do a televisit (with interpreter) to discuss starting medications for fertility .  Dr. Valentino Saxon

## 2018-11-15 NOTE — Telephone Encounter (Signed)
Patient has been scheduled to see Dr. Cherry.  

## 2018-11-20 ENCOUNTER — Encounter: Payer: Self-pay | Admitting: Obstetrics and Gynecology

## 2018-11-20 ENCOUNTER — Ambulatory Visit (INDEPENDENT_AMBULATORY_CARE_PROVIDER_SITE_OTHER): Payer: Self-pay | Admitting: Obstetrics and Gynecology

## 2018-11-20 ENCOUNTER — Other Ambulatory Visit: Payer: Self-pay

## 2018-11-20 VITALS — Ht 64.0 in | Wt 142.0 lb

## 2018-11-20 DIAGNOSIS — Z319 Encounter for procreative management, unspecified: Secondary | ICD-10-CM

## 2018-11-20 MED ORDER — MEDROXYPROGESTERONE ACETATE 10 MG PO TABS: 10.0000 mg | ORAL_TABLET | Freq: Every day | ORAL | 3 refills | Status: DC

## 2018-11-20 MED ORDER — LETROZOLE 2.5 MG PO TABS: 2.5000 mg | ORAL_TABLET | Freq: Every day | ORAL | 2 refills | Status: DC

## 2018-11-20 NOTE — Patient Instructions (Signed)
  Femara PATIENT INSTRUCTIONS  WHY USE IT? Clomid helps your ovaries to release eggs (ovulate).  HOW TO USE IT? Femara is taken as a pill usually on days 5,6,7,8, & 9 of your cycle.  Day 1 is the first day of your period. The dose or duration may be changed to achieve ovulation.  Provera (progesterone) may first be used to bring on a period for some patients.  If you do not get pregnant this cycle, for your next cycles, take on days 1, 2, 3, 4 and 5.  If you do not get a period, take Provera 10 mg daily for 10 days to bring on a period; the first day you get bleeding is Day 1 of your cycle. The day of ovulation on Femara is usually between cycle day 14 and 17.  Having sexual intercourse at least every other day between cycle day 13 and 18 will improve your chances of becoming pregnant during the Femara cycle.  You may monitor your ovulation using basal body temperature charts or with ovulation kits.  If using the ovulation predictor kits, having intercourse the day of the surge and the two days following is recommended. If you get your period, call when it starts for an appointment with your doctor, so that an exam may be done, and another Femara cycle can be considered if appropriate. If you do not get a period by day 35 of the cycle, please get a blood pregnancy test.  If it is negative, speak to your doctor for instructions to bring on another period and to plan a follow-up appointment.  THINGS TO KNOW: If you get pregnant while using Clomid, your chance of twins is 7% and triplets is less than 1%. Some studies have suggested the use of "fertility drugs" may increase your risk of ovarian cancers in the future.  It is unclear if these drugs increase the risk, or people who have problems with fertility are prone for these cancers.  If there is an actual risk, it is very low.  If you have a history of liver problems or ovarian cancer, it may be wise to avoid this medication.  SIDE EFFECTS:  The most  common side effect is hot flashes (20%).  Breast tenderness, headaches, nausea, bloating may also occur at different times.  Less than 3/1,000 people have dryness or loss of hair.  Persistent ovarian cysts may form from the use of this medication.  Ovarian hyperstimulation syndrome is a rare side effect at low doses.  Visual changes like flashes of light or blurring.

## 2018-11-20 NOTE — Progress Notes (Signed)
Virtual Visit via Telephone Note  I connected with Amber Austin on 11/20/18 at  2:53 PM EDT by telephone and verified that I am speaking with the correct person using two identifiers.  A Language Line interpreter was used for today's encounter, Interpreter [phone] as patient speaks [place].    I discussed the limitations, risks, security and privacy concerns of performing an evaluation and management service by telephone and the availability of in person appointments. I also discussed with the patient that there may be a patient responsible charge related to this service. The patient expressed understanding and agreed to proceed.   History of Present Illness: Amber Austin is a 48 y.o. G5P0010 female who present for further discussion of infertility. Notes that despite having possible "pregnancy symptoms" last week, she had a negative pregnancy test.  Would like to begin fertility medications at this time. Still has not had a menstrual cycle.  Patient's last menstrual period was 10/01/2018.    Observations/Objective: Today's Vitals   11/20/18 1442  Weight: 142 lb (64.4 kg)  Height: 5' 4"  (1.626 m)   Body mass index is 24.37 kg/m.   Assessment and Plan: Infertility - Given that patient is now having anovulatory cycles, discussed Femara challenge/therapy.  She was given Rx for Provera 41m po qd x 7 days.   She was told to watch for withdrawal bleeding after the course, the first day of bleeding will be Day 1 for her next cycle.  She was also given a Rx for Femara 2.5 mg po qd to take on days 5-9 of her cycle.  The risks of Femara including ovarian hyperstimulation with possible risk of ovarian cancer as well as multiple gestation were discussed with patient.  Patient also advised to continue frequent intercourse especially around Day 14 of her upcoming cycle (qod intercourse around days 9 - 18).  By Day 35, patient was told to call if she had not had her period.  She can have a total of 6  cycles, with increase in dose after the second cycle.  If patient does not have bleeding, she was told to do a pregnancy test or may need to come in for further evaluation. The patient was also instructed to continue her vaginal progesterone during days 14-28 of her cycle, and to continue daily low-dose aspirin.  She inquired about the use of ovulation kits vs having labs drawn in office. After discussion of each option, she prefers to perform ovulation kit testing at home. Also discussed using a phone app to track her menses.    Follow Up Instructions:   I discussed the assessment and treatment plan with the patient. The patient was provided an opportunity to ask questions and all were answered. The patient agreed with the plan and demonstrated an understanding of the instructions.   The patient was advised to call back or seek an in-person evaluation if the symptoms worsen or if the condition fails to improve as anticipated.  I provided 27 minutes of non-face-to-face time during this encounter.   ARubie Maid MD

## 2018-11-20 NOTE — Progress Notes (Signed)
Telephone visit. Used [place] interpreter # [phone].  Pt called and transferred from front desk. CC, visits, medications, and allergies reviewed and updated. Pt is unsure about taking Progesterone vaginal. Pt LMP 10/01/18

## 2019-02-12 DIAGNOSIS — L309 Dermatitis, unspecified: Secondary | ICD-10-CM | POA: Diagnosis not present

## 2019-02-15 DIAGNOSIS — R21 Rash and other nonspecific skin eruption: Secondary | ICD-10-CM | POA: Diagnosis not present

## 2019-02-15 DIAGNOSIS — J300 Vasomotor rhinitis: Secondary | ICD-10-CM | POA: Diagnosis not present

## 2019-02-15 DIAGNOSIS — Z91013 Allergy to seafood: Secondary | ICD-10-CM | POA: Diagnosis not present

## 2019-03-12 DIAGNOSIS — N96 Recurrent pregnancy loss: Secondary | ICD-10-CM | POA: Diagnosis not present

## 2019-03-13 DIAGNOSIS — N979 Female infertility, unspecified: Secondary | ICD-10-CM | POA: Diagnosis not present

## 2019-04-24 DIAGNOSIS — N978 Female infertility of other origin: Secondary | ICD-10-CM | POA: Diagnosis not present

## 2019-05-15 ENCOUNTER — Encounter: Payer: Self-pay | Admitting: Obstetrics and Gynecology

## 2019-05-15 ENCOUNTER — Ambulatory Visit (INDEPENDENT_AMBULATORY_CARE_PROVIDER_SITE_OTHER): Payer: BC Managed Care – PPO | Admitting: Obstetrics and Gynecology

## 2019-05-15 ENCOUNTER — Other Ambulatory Visit: Payer: Self-pay

## 2019-05-15 VITALS — BP 126/75 | HR 82 | Ht 64.0 in | Wt 141.3 lb

## 2019-05-15 DIAGNOSIS — N978 Female infertility of other origin: Secondary | ICD-10-CM

## 2019-05-15 DIAGNOSIS — N96 Recurrent pregnancy loss: Secondary | ICD-10-CM

## 2019-05-15 NOTE — Progress Notes (Signed)
GYNECOLOGY PROGRESS NOTE  Subjective:    Patient ID: Amber Austin, female    DOB: 17-Nov-1970, 48 y.o.   MRN: [id number]  HPI  Patient is a 48 y.o. G47P0020 female who presents for follow up of infertility. Patient was referred to REI secondary to low AMH hormone.  She states that she went to [facility] as well as getting a second opinion from [facility].  She reports that she was told that her AMH levels were too low for any fertility treatments, and that she would need to consider egg donation.  Patient notes that she does not desire to go this route, and instead desires to continue trying on her own.  She wonders if there are any medications/supplements/treatments that can help to improve her ovarian reserve and function. She is still having monthly cycles that are 3-4 days, with minimal dysmenorrhea.   The following portions of the patient's history were reviewed and updated as appropriate: allergies, current medications, past family history, past medical history, past social history, past surgical history and problem list.  Review of Systems Pertinent items noted in HPI and remainder of comprehensive ROS otherwise negative.   Objective:   Blood pressure 126/75, pulse 82, height  (1.626 m), weight 141 lb 4.8 oz (64.1 kg), last menstrual period 05/12/2019, unknown if currently breastfeeding. General appearance: alert and no distress Remainder of exam deferred.    Labs:  Office Visit on 05/17/2018  Component Date Value Ref Range Status  . TSH 05/17/2018 1.710  0.450 - 4.500 uIU/mL Final  . PTT Lupus Anticoagulant 05/17/2018 40.8  0.0 - 51.9 sec Final  . Dilute Viper Venom Time 05/17/2018 29.6  0.0 - 47.0 sec Final  . Lupus Anticoag Interp 05/17/2018 Comment:   Final   No lupus anticoagulant was detected.  . Protein C Antigen 05/17/2018 135  60 - 150 % Final  . Protein S Ag, Total 05/17/2018 84  60 - 150 % Final   Comment: This test was developed and  its performance characteristics determined by [facility]. It has not been cleared or approved by the Food and Drug Administration.   . Protein S Ag, Free 05/17/2018 111  57 - 157 % Final   Comment: This test was developed and its performance characteristics determined by [facility]. It has not been cleared or approved by the Food and Drug Administration.   . hCG Quant 05/17/2018 6  mIU/mL Final   Comment:                      Female (Non-pregnant)    0 -     5                             (Postmenopausal)  0 -     8                      Female (Pregnant)                      Weeks of Gestation                              3                6 -    64  4               10 -   750                              5              217 -  7138                              6              158 - 31795                              7             [id number]                              8            32065 -149571                              9            63803 -151410                             10            46509 -186977                             12            27832 -[id number]                             14            13950 - 62530                             15            12039 - 70971                             16             9040 - 56451                             17             8175 - 55868                             18             8099 - 58176 Roche E                          CLIA methodology   . Factor V Leiden 05/17/2018 Comment   Final   Comment: Result:  Negative (no mutation found) Factor V Leiden is a specific  mutation ([id number]) in the factor V gene that is associated with an increased risk of venous thrombosis. Factor V Leiden is more resistant to inactivation by activated protein C.  As a result, factor V persists in the circulation leading to a mild hyper- coagulable state.  The Leiden mutation accounts for 90% - 95% of APC resistance.  Factor V Leiden has  been reported in patients with deep vein thrombosis, pulmonary embolus, central retinal vein occlusion, cerebral sinus thrombosis and hepatic vein thrombosis. Other risk factors to be considered in the workup for venous thrombosis include the [id number] mutation in the factor II (prothrombin) gene, protein S and C deficiency, and antithrombin deficiencies. Anticardiolipin antibody and lupus anticoagulant analysis may be appropriate for certain patients, as well as homocysteine levels. Contact your local [facility] for information on how to order additional tes                          ting if desired. **Genetic counselors are available for health care providers to**   discuss results at [phone]). Methodology: DNA analysis of the Factor V gene was performed by allele-specific PCR. The diagnostic sensitivity and specificity is >99% for both. Molecular-based testing is highly accurate, but as in any laboratory test, diagnostic errors may occur. All test results must be combined with clinical information for the most accurate interpretation. This test was developed and its performance characteristics determined by [facility]. It has not been cleared or approved by the Food and Drug Administration. References: Voelkerding K (1996).  Clin Lab Med [id number]. Vincenza Hews, PhD, [facility] Ernestene Mention, PhD, [facility] Wyatt Portela, M.S., PhD, [facility] Manya Silvas, PhD, [facility] Bonnielee Haff, PhD, [facility] Alpha Gula PhD, [facility]   . Anticardiolipin IgG 05/17/2018 <9  0 - 14 GPL U/mL Final   Comment:                           Negative:              <15                           Indeterminate:     15 - 20                           Low-Med Positive: >20 - 80                           High Positive:         >80   . Anticardiolipin IgM 05/17/2018 <9  0 - 12 MPL U/mL Final   Comment:                           Negative:              <13                           Indeterminate:     13 -  20                           Low-Med Positive: >20 - 80  High Positive:         >80   . Anticardiolipin IgA 05/17/2018 <9  0 - 11 APL U/mL Final   Comment:                           Negative:              <12                           Indeterminate:     12 - 20                           Low-Med Positive: >20 - 80                           High Positive:         >80   . ANTI-MULLERIAN HORMONE (AMH) 05/17/2018 0.035  ng/mL Final   Comment: For assays employing antibodies, the possibility exists for interference by heterophile antibodies in the samples.1 1. Kricka L.  Interferences in Immunoassays - still a threat.  Clin. Chem. 2000; 46: 1610-9604: 1037-1038. Reference Range: Females 41 - 46y: 0.26 - 5.81 Median 0.58 AMH concentrations of >= 1.06 ng/mL is correlated with a better response to ovarian stimulation, produced more retrievable oocytes and higher odds of live birth according to Gleicher et al.  Garlon HatchetFertility and Sterility. 2010: 54:0981-1914: 94:2824-2827.  The current AMH test method correlates with the study method with a slope of 0.94. Females at risk of ovarian hyperstimulation syndrome or polycystic ovarian syndrome (PCOS) may exhibit elevated serum AMH concentrations.   AMH levels from PCOS patients may be 2 to 5 fold higher than age-appropriate reference interval values. Granulosa cell tumors of the ovary may secrete AMH along with other tumor markers.  Elevated AMH is not specific for malignancy, and the assay sh                          ould not be used exclusively to diagnose or exclude an AMH-secreting ovarian tumor.   . Progesterone 05/17/2018 0.2  ng/mL Final   Comment:                      Follicular phase       0.1 -   0.9                      Luteal phase           1.8 -  23.9                      Ovulation phase        0.1 -  12.0                      Pregnant                         First trimester    11.0 -  44.3                         Second trimester    25.4 -  83.3  Third trimester    58.7 - 214.0                      Postmenopausal         0.0 -   0.1      Labs reviewed from [place], [place] was 0.126 on 03/13/2019.   Assessment:   Infertility (age based)  Plan:   - Lengthy discussion had with patient that the odds of her becoming pregnant were very low (however not impossible) based on her recent AMH levels, which were lower than what was obtained at her last visit at [facility] last year. She has also been counseled on the increased risk of genetic disorders based on age if she did become pregnant, as well as the increased of miscarriage due to age and prior miscarriages in the past. Patient does not desire to use an egg donor which may offer her better success.  Discussed different vitamins and keto/PCOS diet that could possibly help to increase her levels.  Patient would like to try hormones again (such as Femara) for ovulation induction.  Discussed that if her AMH levels are brought up we can potentially discuss use again.  Also encouraged to utilize the vaginal progesterone at the end of each month as her progesterone levels were borderline. - Patient to return in 2 months to repeat AMH levels, as well as her progesterone and estradiol levels.      A total of 25 minutes were spent face-to-face with the patient during this encounter and over half of that time involved counseling and coordination of care.  Rubie Maid, MD [facility] 05/16/2019 10:47 PM

## 2019-05-16 ENCOUNTER — Telehealth: Payer: Self-pay | Admitting: Obstetrics and Gynecology

## 2019-05-16 ENCOUNTER — Encounter: Payer: Self-pay | Admitting: Obstetrics and Gynecology

## 2019-05-16 DIAGNOSIS — N978 Female infertility of other origin: Secondary | ICD-10-CM | POA: Insufficient documentation

## 2019-05-16 MED ORDER — PROGESTERONE 8 % VA GEL: 1.0000 | Freq: Two times a day (BID) | VAGINAL | 4 refills | Status: DC

## 2019-05-16 NOTE — Telephone Encounter (Signed)
The patient called and stated that her prescription was never sent to her pharmacy ([facility] in [place]) Pt stated that she went to pharmacy several times yesterday to check. Pt requesting script to be sent in and a call back as well. Please advise.

## 2019-05-16 NOTE — Patient Instructions (Signed)
Female Infertility  Female infertility refers to a woman's inability to get pregnant (conceive) after a year of having sex regularly (or after 6 months in women over age 46) without using birth control. Infertility can also mean that a woman is not able to carry a pregnancy to full term. Both women and men can have fertility problems. What are the causes? This condition may be caused by:  Problems with reproductive organs. Infertility can result if a woman: ? Has an abnormally short cervix or a cervix that does not remain closed during a pregnancy. ? Has a blockage or scarring in the fallopian tubes. ? Has an abnormally shaped uterus. ? Has uterine fibroids. This is a benign mass of tissue or muscle (tumor) that can develop in the uterus. ? Is not ovulating in a regular way.  Certain medical conditions. These may include: ? Polycystic ovary syndrome (PCOS). This is a hormonal disorder that can cause small cysts to grow on the ovaries. This is the most common cause of infertility in women. ? Endometriosis. This is a condition in which the tissue that lines the uterus (endometrium) grows outside of its normal location. ? Cancer and cancer treatments, such as chemotherapy or radiation. ? Premature ovarian failure. This is when ovaries stop producing eggs and hormones before age 40. ? Sexually transmitted diseases, such as chlamydia or gonorrhea. ? Autoimmune disorders. These are disorders in which the body's defense system (immune system) attacks normal, healthy cells. Infertility can be linked to more than one cause. For some women, the cause of infertility is not known (unexplained infertility). What increases the risk?  Age. A woman's fertility declines with age, especially after her mid-30s.  Being underweight or overweight.  Drinking too much alcohol.  Using drugs such as anabolic steroids, cocaine, and marijuana.  Exercising excessively.  Being exposed to environmental toxins,  such as radiation, pesticides, and certain chemicals. What are the signs or symptoms? The main sign of infertility in women is the inability to get pregnant or carry a pregnancy to full term. How is this diagnosed? This condition may be diagnosed by:  Checking whether you are ovulating each month. The tests may include: ? Blood tests to check hormone levels. ? An ultrasound of the ovaries. ? Taking a small tissue that lines the uterus and checking it under a microscope (endometrial biopsy).  Doing additional tests. This is done if ovulation is normal. Tests may include: ? Hysterosalpingography. This X-ray test can show the shape of the uterus and whether the fallopian tubes are open. ? Laparoscopy. This test uses a lighted tube (laparoscope) to look for problems in the fallopian tubes and other organs. ? Transvaginal ultrasound. This imaging test is used to check for abnormalities in the uterus and ovaries. ? Hysteroscopy. This test uses a lighted tube to check for problems in the cervix and the uterus. To be diagnosed with infertility, both partners will have a physical exam. Both partners will also have an extensive medical and sexual history taken. Additional tests may be done. How is this treated? Treatment depends on the cause of infertility. Most cases of infertility in women are treated with medicine or surgery.  Women may take medicine to: ? Correct ovulation problems. ? Treat other health conditions.  Surgery may be done to: ? Repair damage to the ovaries, fallopian tubes, cervix, or uterus. ? Remove growths from the uterus. ? Remove scar tissue from the uterus, pelvis, or other organs. Assisted reproductive technology (ART) Assisted reproductive technology (  ART) refers to all treatments and procedures that combine eggs and sperm outside the body to try to help a couple conceive. ART is often combined with fertility drugs to stimulate ovulation. Sometimes ART is done using eggs  retrieved from another woman's body (donor eggs) or from previously frozen fertilized eggs (embryos). There are different types of ART. These include:  Intrauterine insemination (IUI). A long, thin tube is used to place sperm directly into a woman's uterus. This procedure: ? Is effective for infertility caused by sperm problems, including low sperm count and low motility. ? Can be used in combination with fertility drugs.  In vitro fertilization (IVF). This is done when a woman's fallopian tubes are blocked or when a man has low sperm count. In this procedure: ? Fertility drugs are used to stimulate the ovaries to produce multiple eggs. ? Once mature, these eggs are removed from the body and combined with the sperm to be fertilized. ? The fertilized eggs are then placed into the woman's uterus. Follow these instructions at home:  Take over-the-counter and prescription medicines only as told by your health care provider.  Do not use any products that contain nicotine or tobacco, such as cigarettes and e-cigarettes. If you need help quitting, ask your health care provider.  If you drink alcohol, limit how much you have to 1 drink a day.  Make dietary changes to lose weight or maintain a healthy weight. Work with your health care provider and a dietitian to set a weight-loss goal that is healthy and reasonable for you.  Seek support from a counselor or support group to talk about your concerns related to infertility. Couples counseling may be helpful for you and your partner.  Practice stress reduction techniques that work well for you, such as regular physical activity, meditation, or deep breathing.  Keep all follow-up visits as told by your health care provider. This is important. Contact a health care provider if you:  Feel that stress is interfering with your life and relationships.  Have side effects from treatments for infertility. Summary  Female infertility refers to a woman's  inability to get pregnant (conceive) after a year of having sex regularly (or after 6 months in women over age 46) without using birth control.  To be diagnosed with infertility, both partners will have a physical exam. Both partners will also have an extensive medical and sexual history taken.  Seek support from a counselor or support group to talk about your concerns related to infertility. Couples counseling may be helpful for you and your partner. This information is not intended to replace advice given to you by your health care provider. Make sure you discuss any questions you have with your health care provider. Document Released: 07/29/2003 Document Revised: 11/16/2018 Document Reviewed: 06/27/2017 Elsevier Patient Education  2020 Elsevier Inc.  

## 2019-05-17 ENCOUNTER — Telehealth: Payer: Self-pay | Admitting: Obstetrics and Gynecology

## 2019-05-17 NOTE — Telephone Encounter (Signed)
The patient called and stated that she needs a call back in regards to her not having a script called in to her pharmacy. Please advise.

## 2019-05-17 NOTE — Telephone Encounter (Signed)
Please see another phone encounter.  

## 2019-05-17 NOTE — Telephone Encounter (Signed)
Pt called and informed that her medication has been sent [facility] instead of [facility]. Pt was asked if she wanted to change the location. Pt stated that it was okay and that it could stay at [facility].

## 2019-05-18 ENCOUNTER — Other Ambulatory Visit: Payer: Self-pay

## 2019-05-18 MED ORDER — PROGESTERONE 8 % VA GEL: 1.0000 | Freq: Two times a day (BID) | VAGINAL | 4 refills | Status: DC

## 2019-07-16 ENCOUNTER — Other Ambulatory Visit: Payer: Self-pay | Admitting: Obstetrics and Gynecology

## 2019-07-16 NOTE — Telephone Encounter (Signed)
Please inform patient that she needed f/u labs before having her prescription filled (Antimullerian hormone, estrogen, progesterone, FSH/LH).

## 2019-08-09 NOTE — Telephone Encounter (Signed)
Used [place] interpreter # [phone]. Pt is aware of information and appointment made to do lab work.

## 2019-08-14 ENCOUNTER — Other Ambulatory Visit: Payer: Self-pay

## 2019-08-21 ENCOUNTER — Other Ambulatory Visit: Payer: BC Managed Care – PPO

## 2019-08-21 ENCOUNTER — Other Ambulatory Visit: Payer: Self-pay

## 2019-08-21 DIAGNOSIS — N978 Female infertility of other origin: Secondary | ICD-10-CM | POA: Diagnosis not present

## 2019-08-21 NOTE — Addendum Note (Signed)
Addended by: Smith Mince on: 08/21/2019 01:41 PM   Modules accepted: Orders

## 2019-08-25 LAB — ESTRADIOL: Estradiol: 69.2 pg/mL

## 2019-08-25 LAB — ANTI MULLERIAN HORMONE: ANTI-MULLERIAN HORMONE (AMH): 0.034 ng/mL

## 2019-08-25 LAB — PROGESTERONE: Progesterone: 4.6 ng/mL

## 2019-08-27 ENCOUNTER — Telehealth: Payer: Self-pay | Admitting: Obstetrics and Gynecology

## 2019-08-27 ENCOUNTER — Other Ambulatory Visit: Payer: Self-pay | Admitting: Internal Medicine

## 2019-08-27 DIAGNOSIS — Z131 Encounter for screening for diabetes mellitus: Secondary | ICD-10-CM | POA: Diagnosis not present

## 2019-08-27 DIAGNOSIS — N959 Unspecified menopausal and perimenopausal disorder: Secondary | ICD-10-CM | POA: Diagnosis not present

## 2019-08-27 DIAGNOSIS — L309 Dermatitis, unspecified: Secondary | ICD-10-CM | POA: Diagnosis not present

## 2019-08-27 DIAGNOSIS — Z0000 Encounter for general adult medical examination without abnormal findings: Secondary | ICD-10-CM | POA: Diagnosis not present

## 2019-08-27 DIAGNOSIS — Z1231 Encounter for screening mammogram for malignant neoplasm of breast: Secondary | ICD-10-CM | POA: Diagnosis not present

## 2019-08-27 DIAGNOSIS — Z13220 Encounter for screening for lipoid disorders: Secondary | ICD-10-CM | POA: Diagnosis not present

## 2019-09-05 NOTE — Telephone Encounter (Signed)
error 

## 2019-09-11 ENCOUNTER — Encounter: Payer: Self-pay | Admitting: Obstetrics and Gynecology

## 2019-09-11 ENCOUNTER — Ambulatory Visit: Payer: BC Managed Care – PPO | Admitting: Obstetrics and Gynecology

## 2019-09-11 ENCOUNTER — Other Ambulatory Visit: Payer: Self-pay

## 2019-09-11 VITALS — BP 120/77 | HR 94 | Ht 64.0 in | Wt 142.0 lb

## 2019-09-11 DIAGNOSIS — N978 Female infertility of other origin: Secondary | ICD-10-CM | POA: Diagnosis not present

## 2019-09-11 MED ORDER — LETROZOLE 2.5 MG PO TABS: 5.0000 mg | ORAL_TABLET | Freq: Every day | ORAL | 0 refills | Status: DC

## 2019-09-11 MED ORDER — MEDROXYPROGESTERONE ACETATE 10 MG PO TABS: 10.0000 mg | ORAL_TABLET | Freq: Every day | ORAL | 2 refills | Status: DC

## 2019-09-11 NOTE — Progress Notes (Signed)
Pt present to discuss test results and fertility issues. Pt stated her last cycle was 08/17/19 and last for 2 weeks wight light bleeding and spotting.

## 2019-09-11 NOTE — Patient Instructions (Addendum)
  FEMARA PATIENT INSTRUCTIONS  WHY USE IT? Clomid helps your ovaries to release eggs (ovulate).  HOW TO USE IT? Clomid is taken as a pill usually on days 5,6,7,8, & 9 of your cycle.  Day 1 is the first day of your period. The dose or duration may be changed to achieve ovulation.  Provera (progesterone) may first be used to bring on a period for some patients.  If you do not get pregnant this cycle, for your next cycles, take on days 1, 2, 3, 4 and 5.  If you do not get a period, take Provera 10 mg daily for 10 days to bring on a period; the first day you get bleeding is Day 1 of your cycle. The day of ovulation on [facility] is usually between cycle day 14 and 17.  Having sexual intercourse at least every other day between cycle day 13 and 18 will improve your chances of becoming pregnant during the Clomid cycle.  You may monitor your ovulation using basal body temperature charts or with ovulation kits.  If using the ovulation predictor kits, having intercourse the day of the surge and the two days following is recommended. If you get your period, call when it starts for an appointment with your doctor, so that an exam may be done, and another [facility] cycle can be considered if appropriate. If you do not get a period by day 35 of the cycle, please get a blood pregnancy test.  If it is negative, speak to your doctor for instructions to bring on another period and to plan a follow-up appointment.  THINGS TO KNOW: If you get pregnant while using FEMARA, your chance of twins is 7% and triplets is less than 1%. Some studies have suggested the use of "fertility drugs" may increase your risk of ovarian cancers in the future.  It is unclear if these drugs increase the risk, or people who have problems with fertility are prone for these cancers.  If there is an actual risk, it is very low.  If you have a history of liver problems or ovarian cancer, it may be wise to avoid this medication.  SIDE EFFECTS:  The most  common side effect is hot flashes (20%).  Breast tenderness, headaches, nausea, bloating may also occur at different times.  Less than 3/1,000 people have dryness or loss of hair.  Persistent ovarian cysts may form from the use of this medication.  Ovarian hyperstimulation syndrome is a rare side effect at low doses.  Visual changes like flashes of light or blurring.

## 2019-09-11 NOTE — Progress Notes (Signed)
    GYNECOLOGY PROGRESS NOTE  Subjective:    Patient ID: Amber Austin, female    DOB: 05-Jul-1971, 49 y.o.   MRN: [id number]  HPI  Patient is a 49 y.o. G74P0020 female who presents for discussion of test results and f/u of infertility.  She notes that she is taking her supplements as directed.  Notes periods are lighter (mostly spotting), lasting ~ 2 weeks. Is using fertility kits. Wonders why she is still not able to get pregnant.     The following portions of the patient's history were reviewed and updated as appropriate: allergies, current medications, past family history, past medical history, past social history, past surgical history and problem list.   Review of Systems Pertinent items noted in HPI and remainder of comprehensive ROS otherwise negative.   Objective:   Blood pressure 120/77, pulse 94, height 5\' 4"  (1.626 m), weight 142 lb (64.4 kg), last menstrual period 08/17/2019, unknown if currently breastfeeding. General appearance: alert and no distress Remainder of exam deferred.    Labs:  Results for orders placed or performed in visit on 05/15/19  Estradiol  Result Value Ref Range   Estradiol 69.2 pg/mL  Progesterone  Result Value Ref Range   Progesterone 4.6 ng/mL  Anti mullerian hormone  Result Value Ref Range   ANTI-MULLERIAN HORMONE (AMH) 0.034 ng/mL    Assessment:   1. Age related female infertility    Plan:   1. Lengthy discussion had again regarding with patient that the odds of her becoming pregnant were very low (however not impossible) based on her recent AMH levels. No significant change noted in levels despite use of supplements. She has also been previously counseled on the increased risk of genetic disorders based on age if she did become pregnant, as well as the increased of miscarriage due to age and prior miscarriages in the past. Patient has seen [facility] REI specialist in the past who recommended egg donor. Patient does not desire to use an egg  donor although it may offer her better success.  Has been taking vaginal progesterone to boost her levels and endometrial support. Advised that she can discontinue use at this time, unless pregnancy occurs. Patient would like to try hormones again (such as Femara) for ovulation induction.  Discussed that medication is unlikely going to make a significant difference due to her low ovarian reserve, but patient still desires to give another try. Also discussed that patient should consider her partner to be tested with semen analysis to assess for low motility/count.  Patient notes that her partner would likely not get tested.   Will prescribe Femara, also will prescribe Provera for withdrawal bleed if cycle does not occur. Discussed side effects of medication.  A total of 25 minutes were spent face-to-face with the patient during this encounter and over half of that time involved counseling and coordination of care.   [facility], MD [facility]

## 2019-09-12 ENCOUNTER — Telehealth: Payer: Self-pay

## 2019-09-12 NOTE — Telephone Encounter (Signed)
Wants to clarify that the patient is to take 2 tablets each day minus cycle days?

## 2019-09-18 ENCOUNTER — Telehealth: Payer: Self-pay

## 2019-09-18 NOTE — Telephone Encounter (Signed)
Patients pharmacy called needed clarification on directions regarding letrozole.

## 2019-09-19 ENCOUNTER — Telehealth: Payer: Self-pay

## 2019-09-19 NOTE — Telephone Encounter (Signed)
Spoke with pharmacy to go over directions of Femara. Directions given and changes has been made at the pharmacy.

## 2019-09-20 IMAGING — US US OB TRANSVAGINAL
1 series · 14 of 28 positions shown · non-contrast
Comparison: None.

CLINICAL DATA: Pregnant, lower abdominal pain, bleeding

EXAM:
OBSTETRIC <14 WK US AND TRANSVAGINAL OB US
TECHNIQUE: Both transabdominal and transvaginal ultrasound examinations were
performed for complete evaluation of the gestation as well as the
maternal uterus, adnexal regions, and pelvic cul-de-sac.
Transvaginal technique was performed to assess early pregnancy.

[Series 1: us ob transvaginal · 14 of 102 slices shown]
[im 4/102]
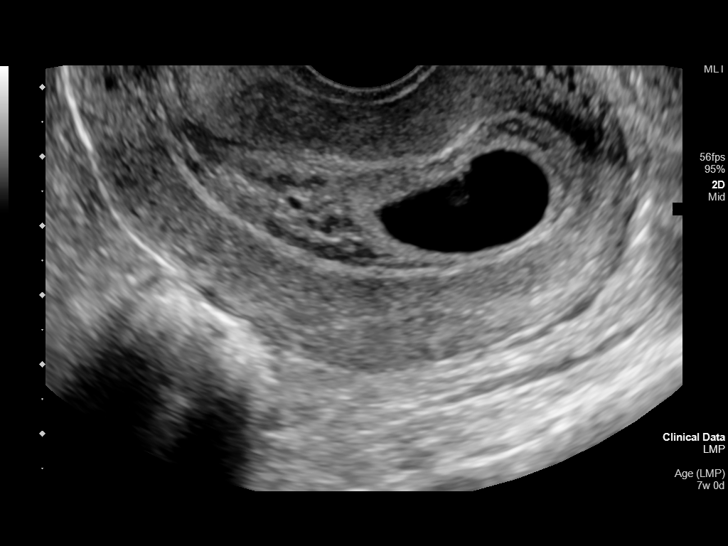
[im 12/102]
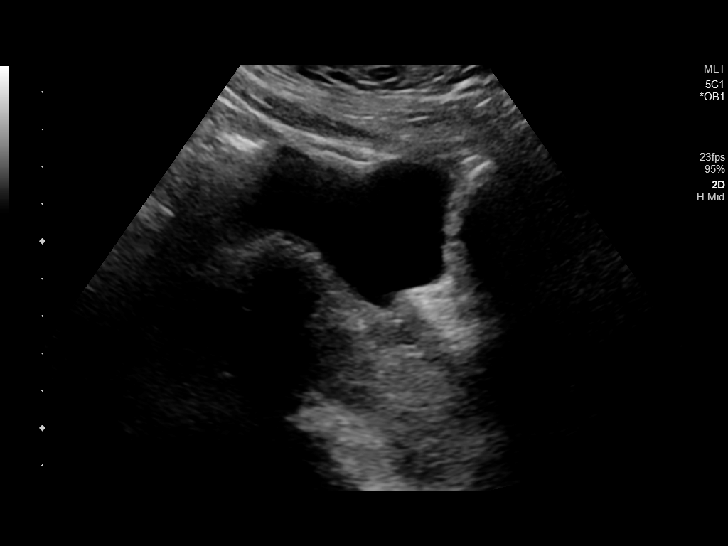
[im 19/102]
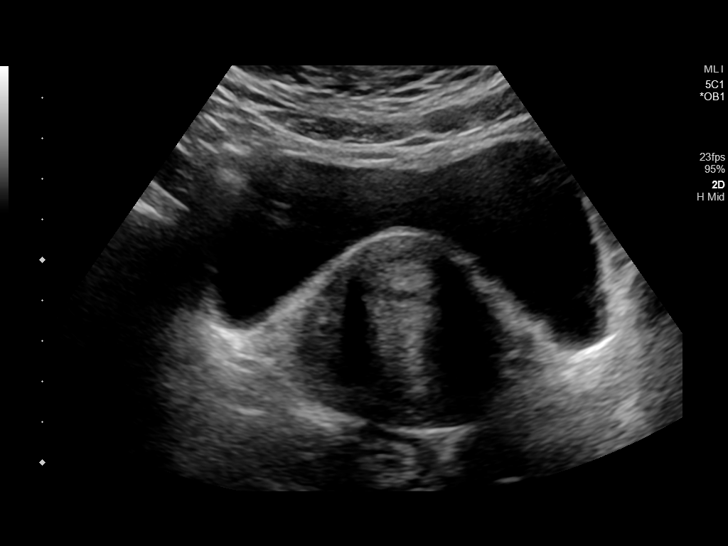
[im 27/102]
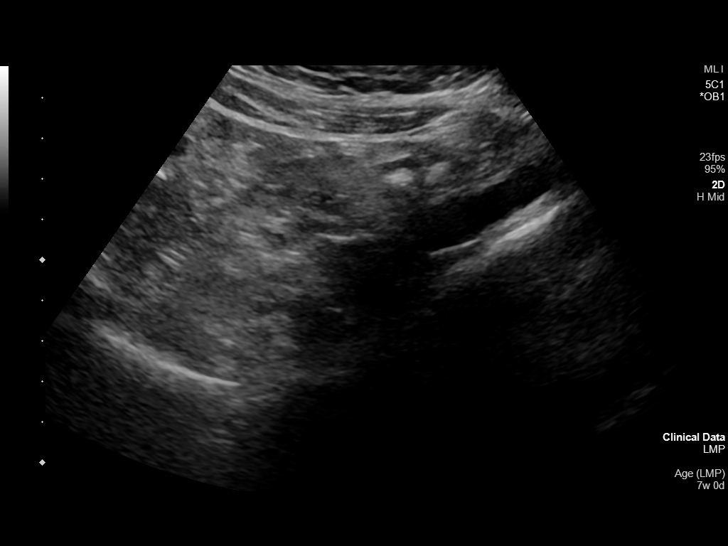
[im 34/102]
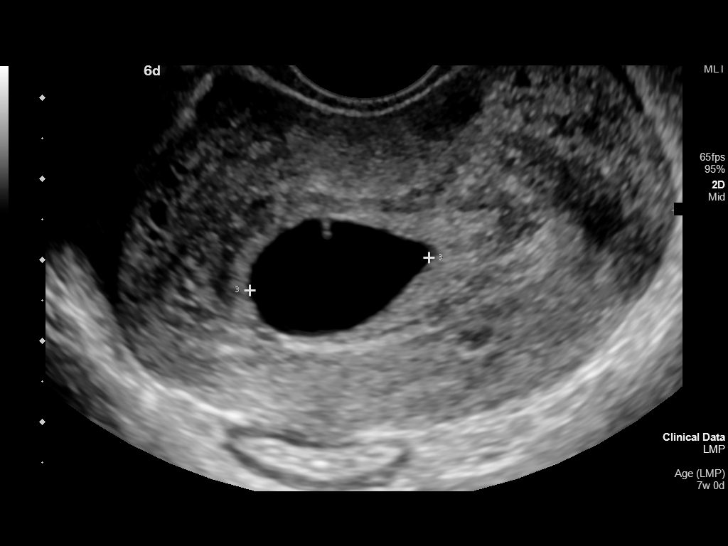
[im 42/102]
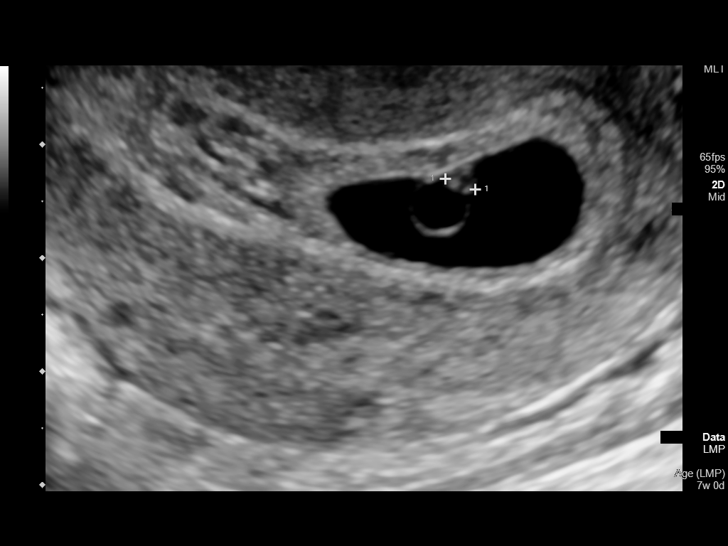
[im 49/102]
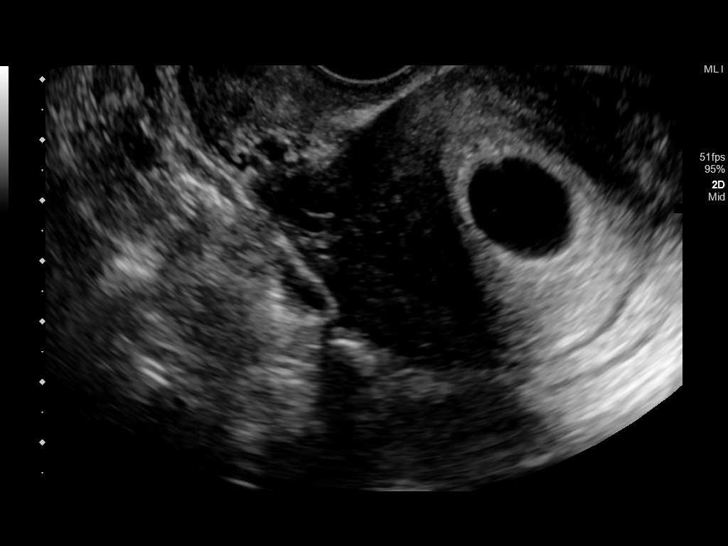
[im 57/102]
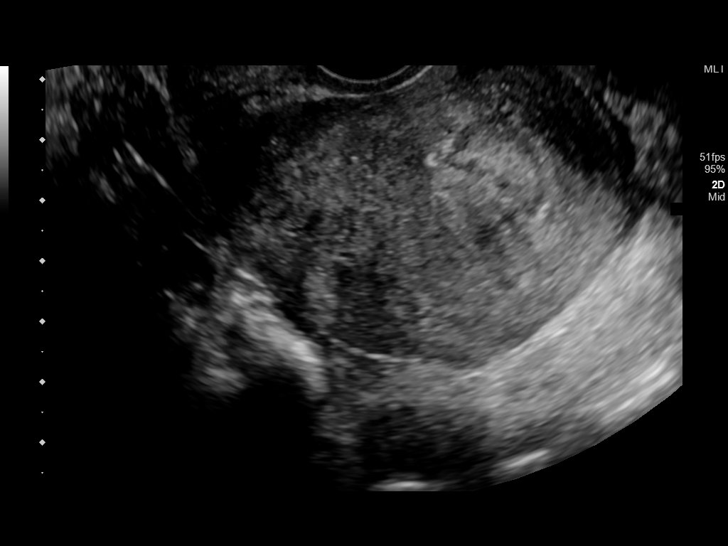
[im 64/102]
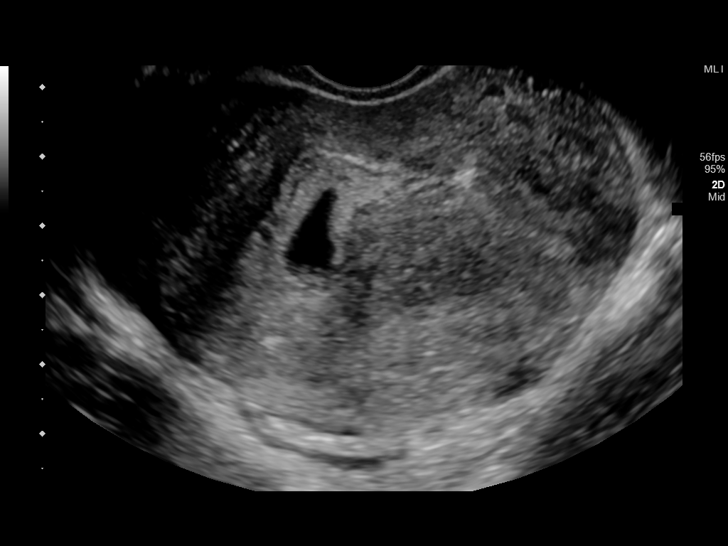
[im 72/102]
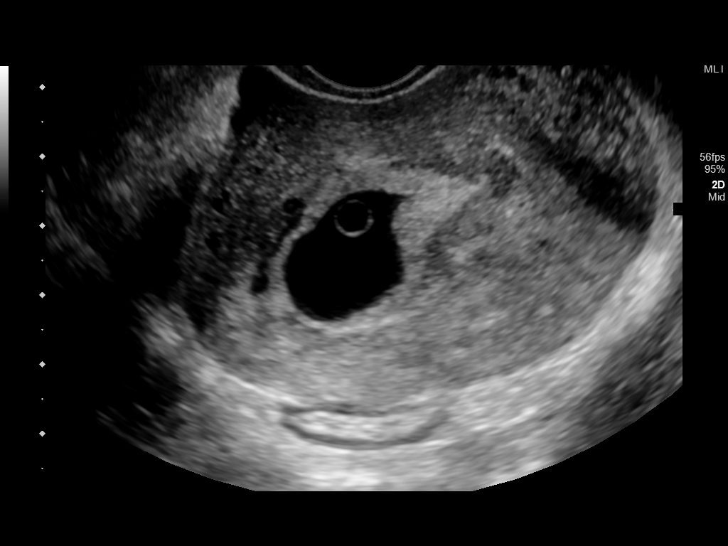
[im 79/102]
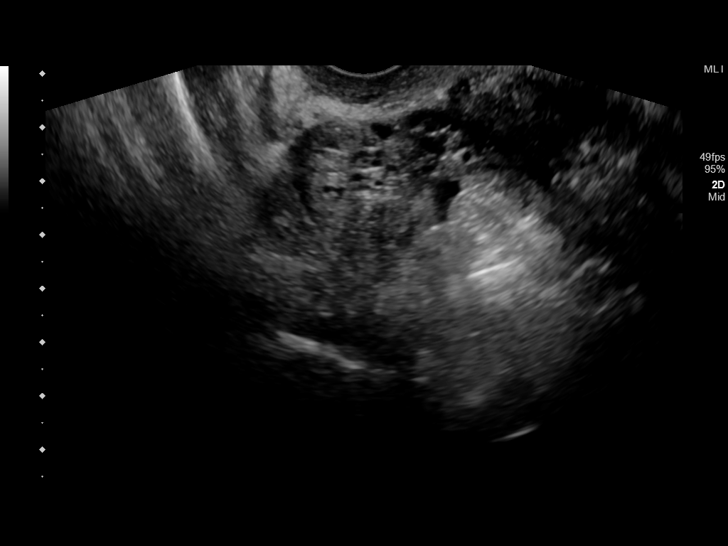
[im 87/102]
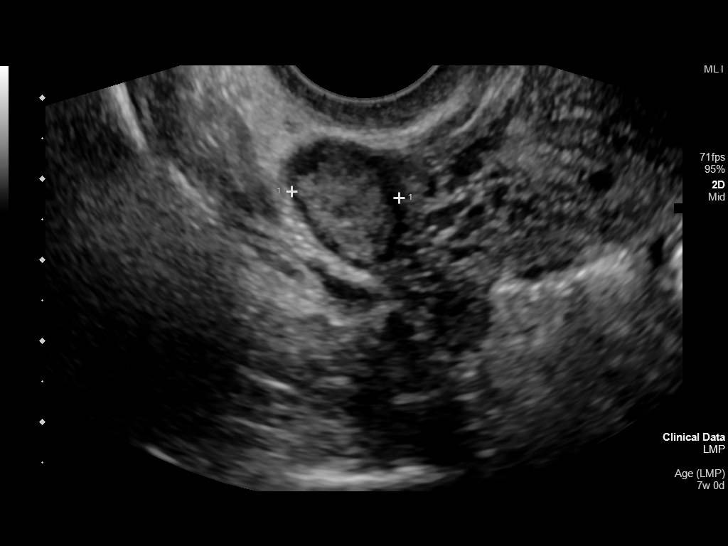
[im 94/102]
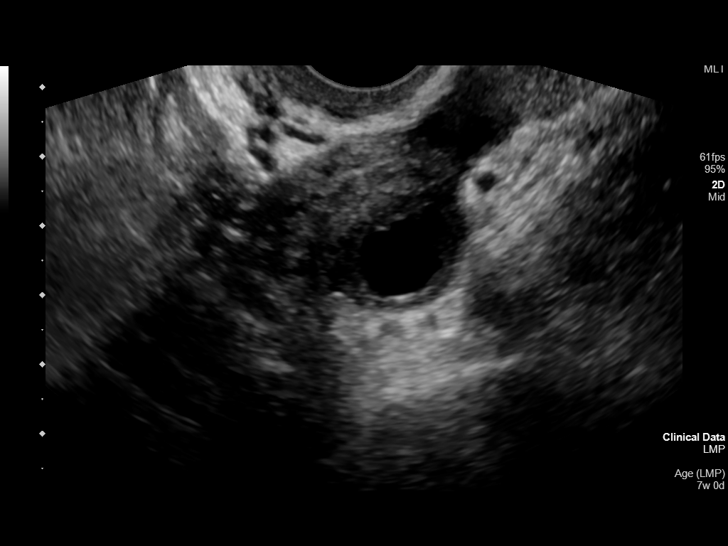
[im 102/102]
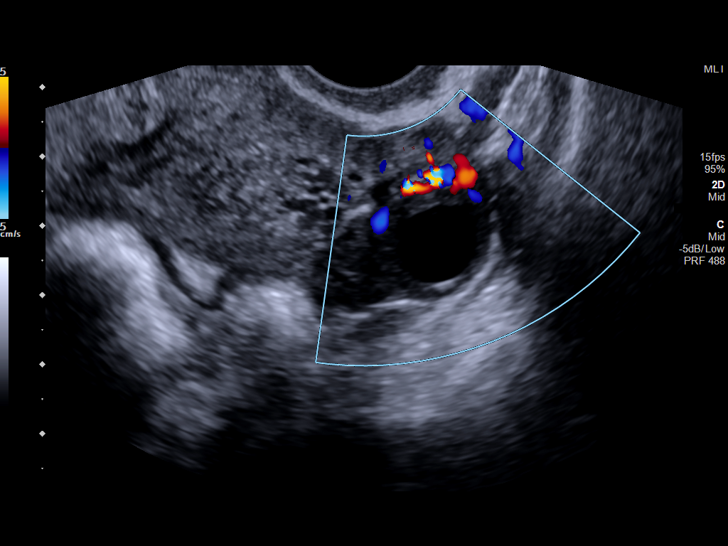

[14 of 28 positions shown; findings below may reference images not displayed]

FINDINGS: Intrauterine gestational sac: Single

Yolk sac:  Visualized.

Embryo:  Visualized.

Cardiac Activity: Visualized.

Heart Rate: 78 bpm

CRL:  2.8 mm   5 w   6 d                  US EDC: 11/26/2018

Subchorionic hemorrhage:  Small subchronic hemorrhage.

Maternal uterus/adnexae: Bilateral ovaries are within normal limits,
noting a left corpus luteal cyst.

Trace pelvic fluid.
IMPRESSION: Single live intrauterine gestation, with estimated gestational age 5
weeks 6 days by crown-rump length.

1st trimester bradycardia. Consider follow-up pelvic ultrasound in
14 days as clinically warranted.

## 2019-09-20 NOTE — Telephone Encounter (Signed)
error 

## 2019-10-23 DIAGNOSIS — Z01419 Encounter for gynecological examination (general) (routine) without abnormal findings: Secondary | ICD-10-CM | POA: Diagnosis not present

## 2019-10-23 DIAGNOSIS — Z1231 Encounter for screening mammogram for malignant neoplasm of breast: Secondary | ICD-10-CM | POA: Diagnosis not present

## 2019-10-23 DIAGNOSIS — Z124 Encounter for screening for malignant neoplasm of cervix: Secondary | ICD-10-CM | POA: Diagnosis not present

## 2019-11-13 ENCOUNTER — Ambulatory Visit
Admission: RE | Admit: 2019-11-13 | Discharge: 2019-11-13 | Disposition: A | Discharge status: Home / Self Care | Payer: BC Managed Care – PPO | Source: Ambulatory Visit | Attending: Internal Medicine | Admitting: Internal Medicine

## 2019-11-13 DIAGNOSIS — Z1231 Encounter for screening mammogram for malignant neoplasm of breast: Secondary | ICD-10-CM

## 2019-12-11 ENCOUNTER — Other Ambulatory Visit: Payer: Self-pay

## 2019-12-11 ENCOUNTER — Ambulatory Visit: Payer: BC Managed Care – PPO | Admitting: Obstetrics and Gynecology

## 2019-12-11 ENCOUNTER — Encounter: Payer: Self-pay | Admitting: Obstetrics and Gynecology

## 2019-12-11 VITALS — BP 110/67 | HR 76 | Ht 64.0 in | Wt 139.9 lb

## 2019-12-11 DIAGNOSIS — N978 Female infertility of other origin: Secondary | ICD-10-CM

## 2019-12-11 NOTE — Progress Notes (Signed)
Pt present for fertility issues. Pt stated that she is currently not taking any of the medication at this time.

## 2019-12-11 NOTE — Progress Notes (Signed)
    GYNECOLOGY PROGRESS NOTE  Subjective:    Patient ID: Amber Austin, female    DOB: [birth year], 49 y.o.   MRN: [id number]  HPI  [place] interpreter present for today's visit. Patient is a 49 y.o. G19P0020 female who presents for f/u of fertility. Notes that she stopped taking the medications in March (caused her cycles to be prolonged).  Patient notes that she had purchased a supplement online that was supposed to help with her fertility (cannot recall the name). Would like to have her hormone levels checked again.   Of note, review of chart notes that patient had a well woman exam at outside clinic with pap smear last month.   The following portions of the patient's history were reviewed and updated as appropriate: allergies, current medications, past family history, past medical history, past social history, past surgical history and problem list.  Review of Systems Pertinent items noted in HPI and remainder of comprehensive ROS otherwise negative.   Objective:   Blood pressure 110/67, pulse 76, height 5\' 4"  (1.626 m), weight 139 lb 14.4 oz (63.5 kg), last menstrual period 12/06/2019, unknown if currently breastfeeding. General appearance: alert and no distress Exam deferred.   Assessment:   Age related infertility  Plan:  - Discussion had again with patient that the odds of her becoming pregnant were very low (however not impossible) based on her last hormone assessment. Desires to have her hormone levels checked again as she has started a new supplement. She has also been previously counseled on the increased risk of genetic disorders based on age if she did become pregnant, as well as the increased of miscarriage due to age and prior miscarriages in the past. Patient has seen [facility] REI specialist in the past who recommended egg donor. Although patient did not desire to use this method previously, may be willing to consider at this point. Also discussed option of adoption, however  patient declines. Advised to make a follow up appointment at [facility] if she is dedicated to conception.     [facility], MD [facility]

## 2019-12-15 LAB — ANTI MULLERIAN HORMONE: ANTI-MULLERIAN HORMONE (AMH): <0.015 ng/mL

## 2019-12-15 LAB — FSH/LH
FSH: 30.8 m[IU]/mL
LH: 10.9 m[IU]/mL

## 2019-12-15 LAB — ESTRADIOL: Estradiol: 19.6 pg/mL

## 2021-05-03 IMAGING — MG DIGITAL SCREENING BILAT W/ TOMO W/ CAD
6 of 10 series · 6 of 30 positions shown · non-contrast
Comparison: Previous exam(s).

CLINICAL DATA: Screening.

EXAM:
DIGITAL SCREENING BILATERAL MAMMOGRAM WITH TOMO AND CAD

[L CC synth-2D]
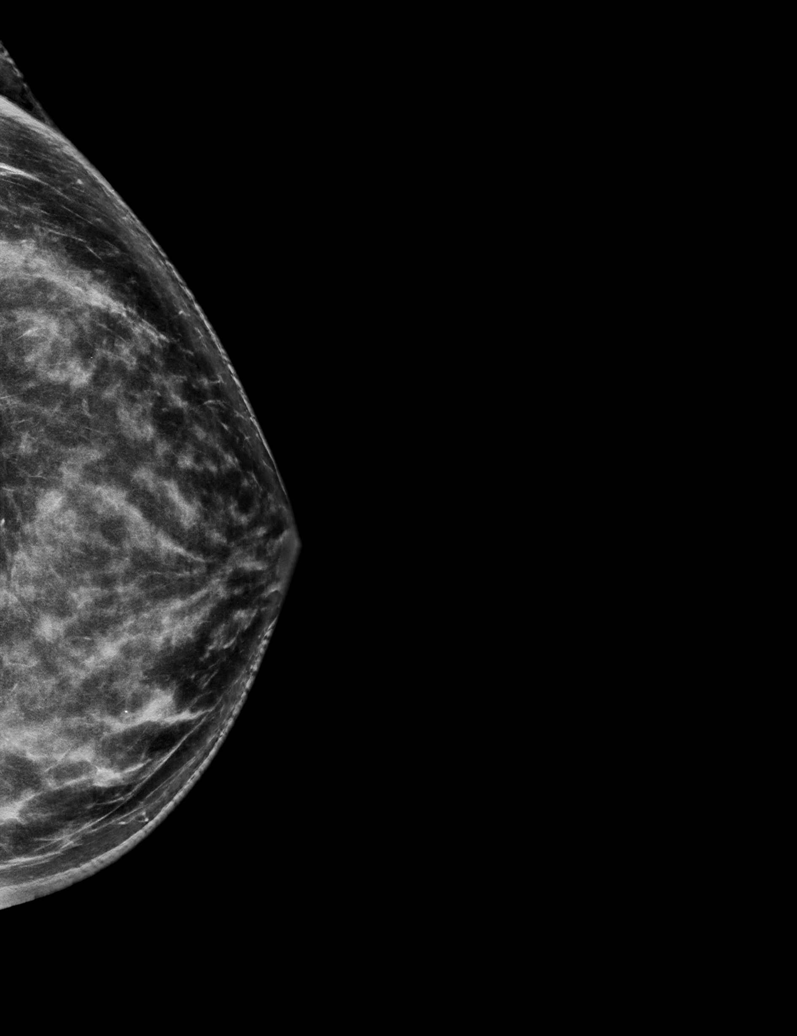

[R MLO synth-2D]
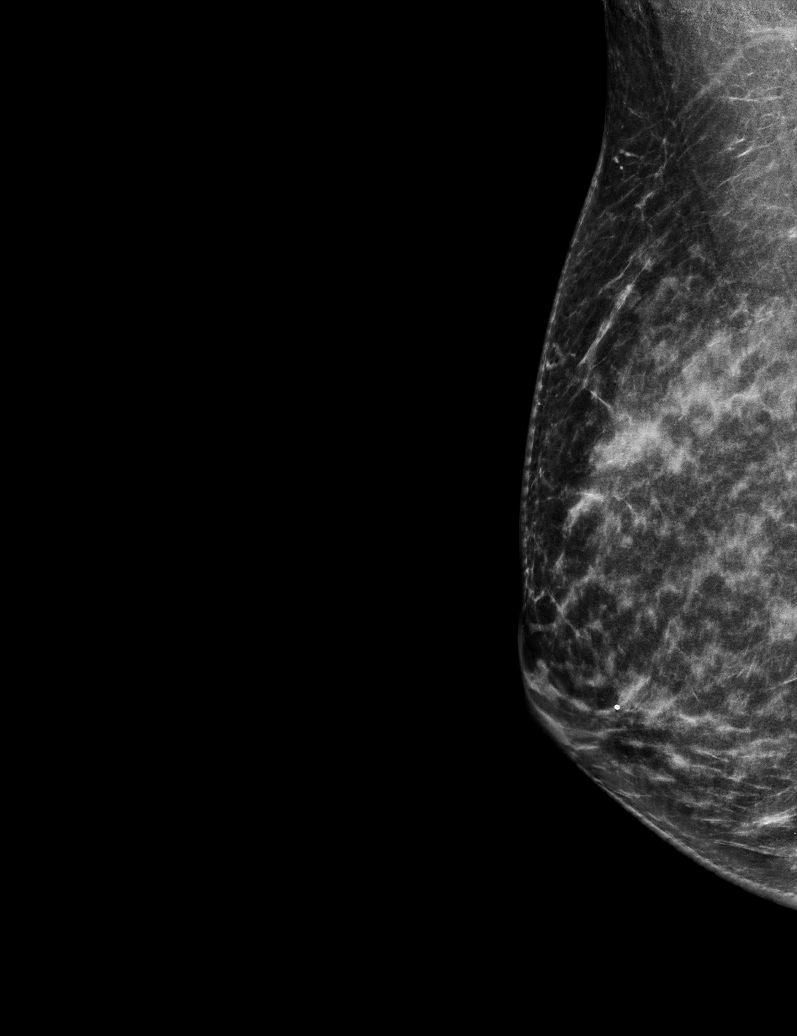

[L MLO synth-2D]
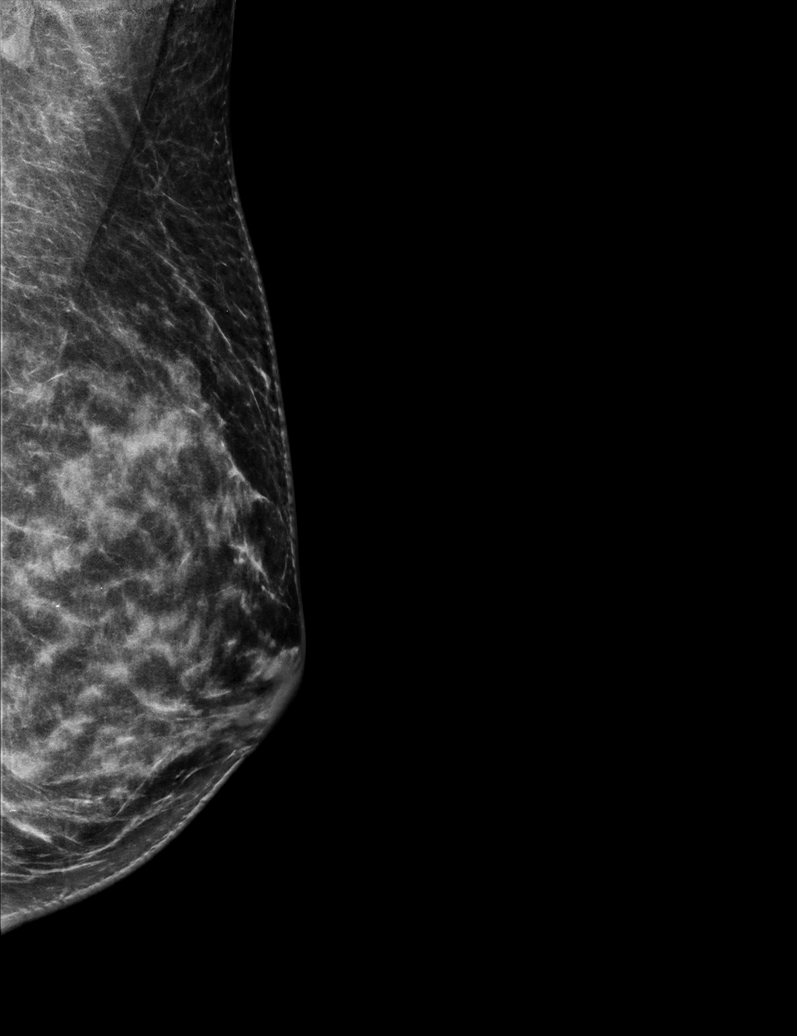

[R CC synth-2D (1 of 2)]
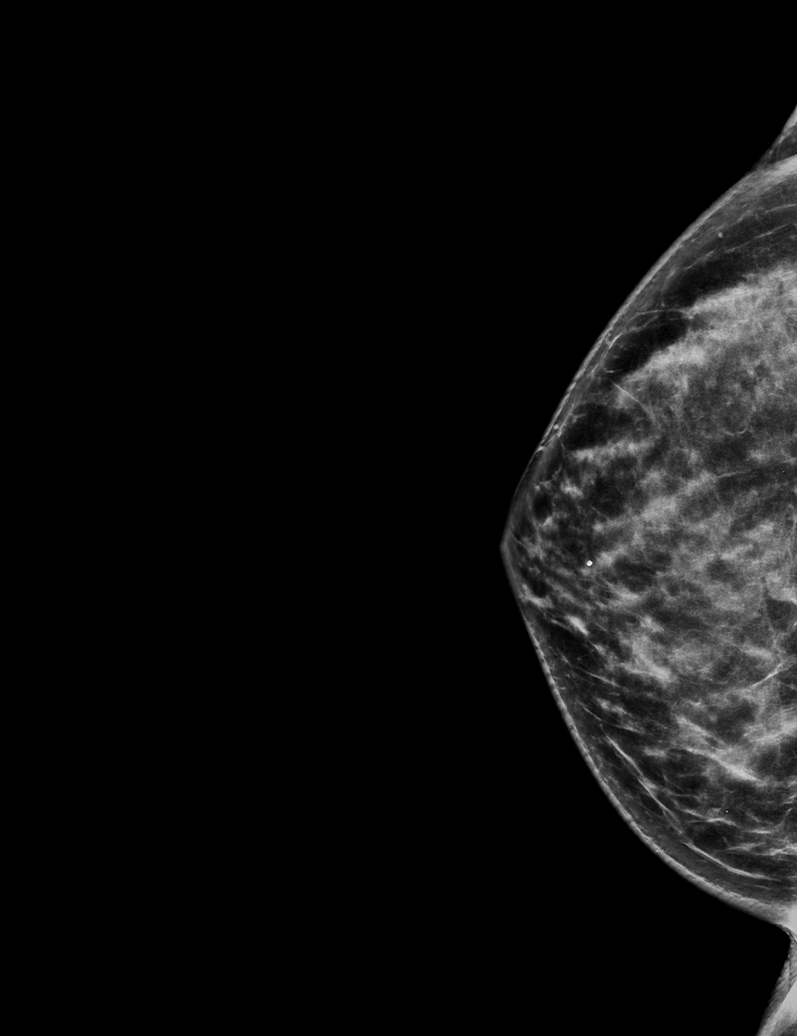

[R CC synth-2D (2 of 2)]
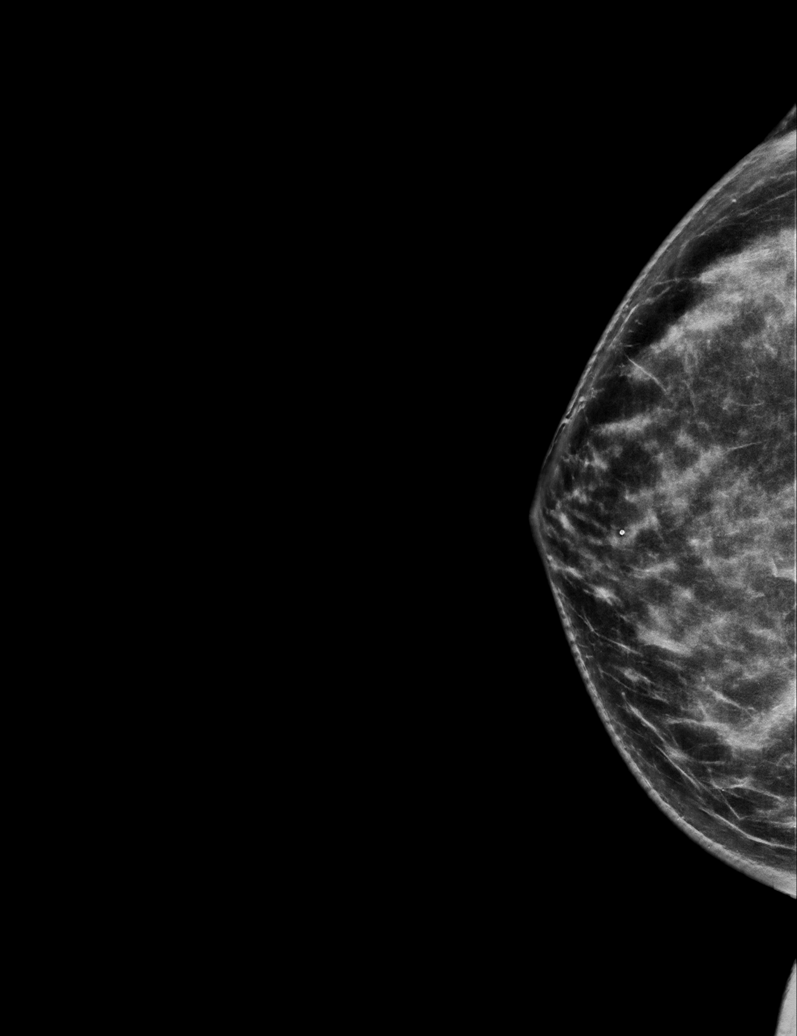

[R MLO tomo · tomo slice 33/65.0]
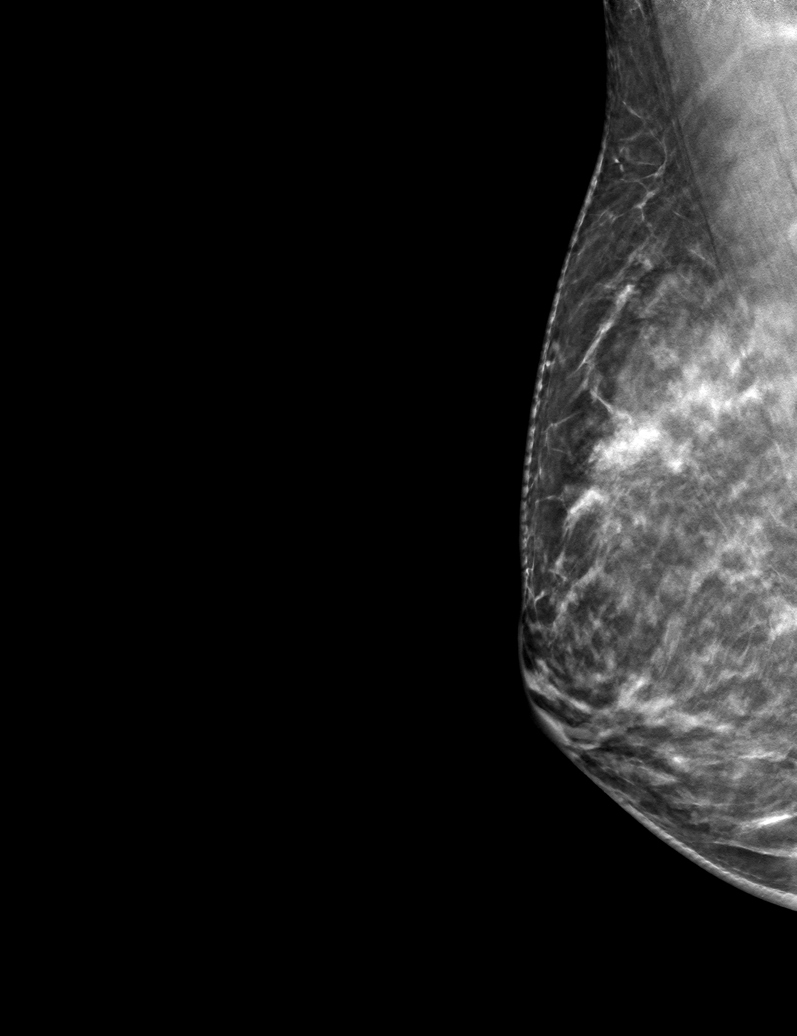

[6 of 30 positions shown; findings below may reference images not displayed]

ACR Breast Density Category c: The breast tissue is heterogeneously
dense, which may obscure small masses.
FINDINGS: There are no findings suspicious for malignancy. Images were
processed with CAD.
IMPRESSION: No mammographic evidence of malignancy. A result letter of this
screening mammogram will be mailed directly to the patient.

RECOMMENDATION:
Screening mammogram in one year. (Code:FT-U-LHB)

BI-RADS CATEGORY  1: Negative.

## 2023-11-14 ENCOUNTER — Ambulatory Visit: Payer: Self-pay

## 2023-11-14 NOTE — Telephone Encounter (Signed)
  Chief Complaint: bilateral chest and back of neck pain Symptoms: moderate Bilateral chest pain that comes and goes/back of neck pain Frequency: back neck 2 weeks ago, Chest pain: 3 days ago  Pertinent Negatives: Patient denies SOB, radiating pain, N/V, sweating, dizziness Disposition: [] ED /[x] Urgent Care (no appt availability in office) / [] Appointment(In office/virtual)/ []  [place] [facility]/ [] Home Care/ [] Refused Recommended Disposition /[] [facility]/ []  Follow-up with PCP Additional Notes: pt has no PCP- wanting to establish with [facility]- appt made with NP and put on wait list Copied from CRM [id number]. Topic: Clinical - Red Word Triage >> Nov 14, 2023  9:03 AM Shon Hale wrote: Red Word that prompted transfer to Nurse Triage: Pain in neck and chest Reason for Disposition  [1] Chest pain lasts < 5 minutes AND [2] NO chest pain or cardiac symptoms (e.g., breathing difficulty, sweating) now  (Exception: Chest pains that last only a few seconds.)  Answer Assessment - Initial Assessment Questions 1. LOCATION: "Where does it hurt?"       Right and left sides  2. RADIATION: "Does the pain go anywhere else?" (e.g., into neck, jaw, arms, back)     Back of neck  3. ONSET: "When did the chest pain begin?" (Minutes, hours or days)      Back of neck pain: 2 week ago   chest pain 3 days 4. PATTERN: "Does the pain come and go, or has it been constant since it started?"  "Does it get worse with exertion?"      Comes goes 5. DURATION: "How long does it last" (e.g., seconds, minutes, hours)     Few minutes  6. SEVERITY: "How bad is the pain?"  (e.g., Scale 1-10; mild, moderate, or severe)    - MILD (1-3): doesn't interfere with normal activities     - MODERATE (4-7): interferes with normal activities or awakens from sleep    - SEVERE (8-10): excruciating pain, unable to do any normal activities       Moderate neck    chest pain:7/10 10. OTHER SYMPTOMS: "Do you have any other symptoms?"  (e.g., dizziness, nausea, vomiting, sweating, fever, difficulty breathing, cough)       No, clearing throat  11. PREGNANCY: "Is there any chance you are pregnant?" "When was your last menstrual period?"       N/a  Protocols used: Chest Pain-A-AH

## 2024-02-27 ENCOUNTER — Ambulatory Visit: Payer: Self-pay

## 2024-02-27 NOTE — Telephone Encounter (Signed)
   Message from [facility] sent at 02/27/2024 11:35 AM EDT  Summary: pain   Patient is having symptoms of strep throat for four days, first it was a little sore on one side and now it's sore on both sides, and red at the back and her tongue is sore and the patients husband is calling to ask what should the patient do should she got to urgent care, patient had spots on both of her arms that have appeared not sure if this is related Pt num [phone] Amber Austin husband talking for the wife      Reason for Disposition  Caller has already spoken with another triager and has no further questions.    Another triager called patient back before this Clinical research associate spoke with spouse. Spouse Amber Austin had additional questions re: patient resistant to recommended UC evaluation and inquiring if it will resolve on its own and be evaluated at new patient appointment in August.  Explained importance of evaluation since she has throat discomfort for several days along with extremity rash. Spouse verbalized understanding and will proceed to urgent care as recommended.  Protocols used: No Contact or Duplicate Contact Call-A-AH

## 2024-02-27 NOTE — Telephone Encounter (Signed)
 FYI Only or Action Required?: FYI only for provider.  Called Nurse Triage reporting Sore Throat.  Symptoms began several days ago.  Interventions attempted: Nothing.  Symptoms are: unchanged.  Triage Disposition: See PCP When Office is Open (Within 3 Days)  Patient/caregiver understands and will follow disposition?: Yes, will follow disposition  Reason for Disposition  [1] Sore throat is the only symptom AND [2] present > 48 hours  Answer Assessment - Initial Assessment Questions 1. ONSET: When did the throat start hurting? (Hours or days ago)      4 days 2. SEVERITY: How bad is the sore throat? (Scale 1-10; mild, moderate or severe)     When I eat it is terrible 3. STREP EXPOSURE: Has there been any exposure to strep within the past week? If Yes, ask: What type of contact occurred?      unsure 4.  VIRAL SYMPTOMS: Are there any symptoms of a cold, such as a runny nose, cough, hoarse voice or red eyes?      Tongue hurts, body hurts, denies runny nose 5. FEVER: Do you have a fever? If Yes, ask: What is your temperature, how was it measured, and when did it start?     denies 6. PUS ON THE TONSILS: Is there pus on the tonsils in the back of your throat?     Red  7. OTHER SYMPTOMS: Do you have any other symptoms? (e.g., difficulty breathing, headache, rash)     Denies  Scheduled New pt visit, advised to go to UC for the sore throat. Pt states she will wait until that appt for evaluation of the sore throat. RN again advised that pt seek UC treatment for the sore throat.  Protocols used: Sore Throat-A-AH

## 2024-02-27 NOTE — Telephone Encounter (Signed)
 This RN made first attempt to triage patient, with interpreter on the line. No answer, LVM. Routing for additional attempts.   Copied from CRM [id number]. Topic: Clinical - Pink Word Triage >> Feb 27, 2024 11:30 AM DeAngela L wrote: Reason for Triage: patient has had strep throat for four days  Pt num cal back Scott husband [phone] >> Feb 27, 2024 11:35 AM DeAngela L wrote: Patient is having symptoms of strep throat for four days, first it was a little sore on one side and now it's sore on both sides, and red at the back and her tongue is sore and the patients husband is calling to ask what should the patient do should she got to urgent care, patient had spots on both of her arms that have appeared not sure if this is related  Pt num [phone] [place] husband talking for the wife

## 2024-03-05 ENCOUNTER — Ambulatory Visit
Admission: EM | Admit: 2024-03-05 | Discharge: 2024-03-05 | Disposition: A | Discharge status: Home / Self Care | Attending: Emergency Medicine | Admitting: Emergency Medicine

## 2024-03-05 ENCOUNTER — Encounter: Payer: Self-pay | Admitting: Emergency Medicine

## 2024-03-05 DIAGNOSIS — R21 Rash and other nonspecific skin eruption: Secondary | ICD-10-CM

## 2024-03-05 LAB — POCT RAPID STREP A (OFFICE): Rapid Strep A Screen: NEGATIVE

## 2024-03-05 MED ORDER — METHYLPREDNISOLONE ACETATE 80 MG/ML IJ SUSP
60.0000 mg | Freq: Once | INTRAMUSCULAR | Status: AC
  Administered 2024-03-05: 60 mg via INTRAMUSCULAR

## 2024-03-05 MED ORDER — PREDNISONE 10 MG (21) PO TBPK: ORAL_TABLET | Freq: Every day | ORAL | 0 refills | Status: DC

## 2024-03-05 NOTE — ED Triage Notes (Signed)
 Patient reports itchy hives and rash all over x  five days. Benadryl, and hydrocortisone. Patient reports that before rash she had a sore throat that started same day.

## 2024-03-05 NOTE — ED Provider Notes (Signed)
 Amber Austin    CSN: [id number] Arrival date & time: 03/05/24  1005      History   Chief Complaint Chief Complaint  Patient presents with   Rash   Sore Throat    HPI Amber Austin is a 53 y.o. female.   Patient presents for evaluation of erythematous rash generalized to the body beginning 5 days ago.  Experienced a sore throat approximately 7 days ago which spontaneously resolved.  Rash initially present to the bilateral arms and then spread to the remainder of the body.  Rash is pruritic, denies drainage or pain.  Has attempted use of Benadryl which has been somewhat helpful.  Denies changes in toiletries, diet, medication or exposure to wooded or plane areas.  No other contact has similar symptoms.  Past Medical History:  Diagnosis Date   Arthritis, rheumatoid [facility])     Patient Active Problem List   Diagnosis Date Noted   Age related female infertility 05/16/2019   Recurrent pregnancy loss 05/17/2018   Antepartum multigravida of advanced maternal age 36/04/2018    Past Surgical History:  Procedure Laterality Date   gallstone      OB History     Gravida  2   Para      Term      Preterm      AB  2   Living         SAB  2   IAB      Ectopic      Multiple      Live Births               Home Medications    Prior to Admission medications   Medication Sig Start Date End Date Taking? Authorizing Provider  predniSONE  (STERAPRED UNI-PAK 21 TAB) 10 MG (21) TBPK tablet Take by mouth daily. Take 6 tabs by mouth daily  for 2 days, then 5 tabs for 2 days, then 4 tabs for 2 days, then 3 tabs for 2 days, 2 tabs for 2 days, then 1 tab by mouth daily for 2 days 03/05/24  Yes Auren Valdes R, NP  letrozole  (FEMARA ) 2.5 MG tablet Take 2 tablets (5 mg total) by mouth daily. Take 1 tablet on Days 5-9 of cycle each month (only 5 days) Patient not taking: Reported on 12/11/2019 09/11/19   Connell Davies, MD  medroxyPROGESTERone  (PROVERA ) 10 MG tablet  Take 1 tablet (10 mg total) by mouth daily. Use for ten days Patient not taking: Reported on 12/11/2019 09/11/19   Connell Davies, MD  Prenatal Vit-Fe Fumarate-FA (PRENATAL MULTIVITAMIN) TABS tablet Take 1 tablet by mouth daily at 12 noon.    [provider]  PROGESTERONE , VAGINAL, 8 % GEL Place 1 applicator vaginally 2 (two) times daily. Use on Days 14-28 of your cycle each month. Patient not taking: Reported on 12/11/2019 05/18/19   Connell Davies, MD    Family History Family History  Problem Relation Age of Onset   Diabetes Mother    Breast cancer Neg Hx     Social History Social History   Tobacco Use   Smoking status: Never   Smokeless tobacco: Never  Vaping Use   Vaping status: Never Used  Substance Use Topics   Alcohol use: Never   Drug use: Never     Allergies   Tomato   Review of Systems Review of Systems  Skin:  Positive for rash.     Physical Exam Triage Vital Signs ED Triage Vitals  Encounter Vitals Group     BP 03/05/24 1059 105/70     Girls Systolic BP Percentile --      Girls Diastolic BP Percentile --      Boys Systolic BP Percentile --      Boys Diastolic BP Percentile --      Pulse Rate 03/05/24 1059 82     Resp 03/05/24 1059 18     Temp 03/05/24 1059 98.4 F (36.9 C)     Temp Source 03/05/24 1059 Oral     SpO2 03/05/24 1059 100 %     Weight --      Height --      Head Circumference --      Peak Flow --      Pain Score 03/05/24 1054 0     Pain Loc --      Pain Education --      Exclude from Growth Chart --    No data found.  Updated Vital Signs BP 105/70 (BP Location: Right Arm)   Pulse 82   Temp 98.4 F (36.9 C) (Oral)   Resp 18   SpO2 100%   Breastfeeding No   Visual Acuity Right Eye Distance:   Left Eye Distance:   Bilateral Distance:    Right Eye Near:   Left Eye Near:    Bilateral Near:     Physical Exam Constitutional:      Appearance: Normal appearance.  HENT:     Mouth/Throat:     Pharynx: No oropharyngeal  exudate or posterior oropharyngeal erythema.  Pulmonary:     Effort: Pulmonary effort is normal.  Skin:    Comments: Erythematous maculopapular rash generalized to the body with scattered blisters filled with serous fluid to the bilateral legs  Neurological:     Mental Status: She is alert and oriented to person, place, and time. Mental status is at baseline.      UC Treatments / Results  Labs (all labs ordered are listed, but only abnormal results are displayed) Labs Reviewed  POCT RAPID STREP A (OFFICE) - Normal    EKG   Radiology No results found.  Procedures Procedures (including critical care time)  Medications Ordered in UC Medications  methylPREDNISolone  acetate (DEPO-MEDROL ) injection 60 mg (60 mg Intramuscular Given 03/05/24 1129)    Initial Impression / Assessment and Plan / UC Course  I have reviewed the triage vital signs and the nursing notes.  Pertinent labs & imaging results that were available during my care of the patient were reviewed by me and considered in my medical decision making (see chart for details).  Rash  Unknown etiology, appears inflammatory, no signs of infection, discussed, rapid strep test negative, discussed, methylprednisolone  IM given and prescribed prednisone  for home use, discussed administration, recommended continued use of antihistamines for management of pruritus, advised against long exposure to heat to prevent further irritation and advised follow-up with primary doctor if symptoms persist Final Clinical Impressions(s) / UC Diagnoses   Final diagnoses:  Rash and nonspecific skin eruption     Discharge Instructions      ?????????????????????????????????????????????? ???????????????????????????????????? ??????????????????????????????????? ?? 1-2 ??????????????????????????? ?????????????????????? ??? ??? ????? ????????????????????????? ?????????????????????  ????????????????????????????????????????????????????????????  ?????????????????????????????????????????  ?????????????????????????????????????????????????????????????????????????????? ?????????????????????????????????????  ???????????????????????????????? ???????????????????????????????????????????????? ???????????????????????????????????  ??????????????????????????????????????????????????????????????? ????????????????????????????????????????????  ????????????????????????????????????????????? ???? ???????????????? ???????????????? ??????????????????????????????????????  ?????????????????????????????????????? ???????????????????????????????????? W?n n?? khu? k?l?ng r?b k?r r?ks??? p?h???n th?? d? h?em??xn x?ks?eb n? k?h?a n?? y?ng m??m? s?????? k?hxng k?r tid che???x doy th??wp? p?h???n tid che???x ca y? lng re???xyn? 1-2 t?h??ng th??  m? che???x rokh n?? xy?? doy th??wp? x?c m? x?k?r dng bwm pwd m? k?h?? la p?hiwh?n?ng r?xn me???x s??mp?h?s? b?ng khr?ng m? n?? h??l xxk m?  p?hl k?r thd s?xb s? te r?p to kh?xkkh?s? bb rwdr?w m?? phb che???x bkhth?re?y n? l? khx pord x?? khid ?? x?k?r c?b khx m? khw?m ke??ywk??xng  w?n n?? khu? d?? r?b k?r c?h?d s? te?y rxy d? th?? khlinik phe???x ch?wy ld k?r x?ks?eb th?? keid k?h??n k?b p?h???n s??ng ca ch?wy ld x?k?r kh?n la rem h??y d? k?h??n  rem t?ngt? w?n phrng n?? p?nt?n p? h??? r?b prath?n phe rd ni son thuk ch? phr?xm x?h??r t?m kh? nan? phe???x h??? krabwnk?r k???ng t?n d?nein t?x p?  khu? s??m?rt?h ch?? kh?l?m?n? h?r??x khr?m ben ? dril bb th? phe???x brrthe? x?k?r kh?n d?? khu? y?ng s??m?rt?h r?b pra th?n ben ? dril bb r?b prath?n d??  pord h?l?k le??yng k?r s??mp?h?s? k?b khw?m r?xn p?n wel? n?n chn k?r x?b n?? xn c?d h?r??x xxk p? k???ng nxk ne???xngc?k x?c th?h??? p?h???n rakh?y khe??xng m?k k?h??n  pord n?d h?m?y phthy? prac? t?w k?hxng khu? x?k khr?ng la phthy? x?c trwc p?hiwh?n?ng k?hxng khu? x?k khr?ng   Today you are being treated for a rash that appears inflammatory, at this  time no signs of infection, typically infectious rashes progressively gets worse in the 1-2 locations that this germ is present, typically can show redness, swelling, pain, fever and the skin is hot to touch, at times there is drainage  Rapid strep test is negative for bacteria to the throat, do not believe that sore throat is related  You have been given an injection of steroids today in the office today to help reduce the inflammatory process that occurs with this rash which will help minimize your itching as well as begin to clear  Starting tomorrow take prednisone  every morning with food as directed, to continue the above process  You may continue use of topical calamine or Benadryl cream to help manage itching, you may also continue oral Benadryl  Please avoid long exposures to heat such as a hot steamy shower or being outside as this may cause further irritation to your rash  Please keep upcoming appointment with your primary doctor and at that time they may recheck your skin   ED Prescriptions     Medication Sig Dispense Auth. Provider   predniSONE  (STERAPRED UNI-PAK 21 TAB) 10 MG (21) TBPK tablet Take by mouth daily. Take 6 tabs by mouth daily  for 2 days, then 5 tabs for 2 days, then 4 tabs for 2 days, then 3 tabs for 2 days, 2 tabs for 2 days, then 1 tab by mouth daily for 2 days 42 tablet Shadee Montoya, Shelba SAUNDERS, NP      PDMP not reviewed this encounter.   Teresa Shelba SAUNDERS, NP 03/05/24 [phone]

## 2024-03-05 NOTE — Discharge Instructions (Signed)
?????????????????????????????????????????????? ???????????????????????????????????? ??????????????????????????????????? ??   1-2 ??????????????????????????? ?????????????????????? ??? ??? ????? ????????????????????????? ?????????????????????  ???????????????????????????????????????????????????????????? ?????????????????????????????????????????  ?????????????????????????????????????????????????????????????????????????????? ?????????????????????????????????????  ???????????????????????????????? ???????????????????????????????????????????????? ???????????????????????????????????  ??????????????????????????????????????????????????????????????? ????????????????????????????????????????????  ????????????????????????????????????????????? ???? ???????????????? ???????????????? ??????????????????????????????????????  ?????????????????????????????????????? ???????????????????????????????????? W?n n?? khu? k?l?ng r?b k?r r?ks??? p?h???n th?? d? h?em??xn x?ks?eb n? k?h?a n?? y?ng m??m? s?????? k?hxng k?r tid che???x doy th??wp? p?h???n tid che???x ca y? lng re???xyn? 1-2 t?h??ng th?? m? che???x rokh n?? xy?? doy th??wp? x?c m? x?k?r dng bwm pwd m? k?h?? la p?hiwh?n?ng r?xn me???x s??mp?h?s? b?ng khr?ng m? n?? h??l xxk m?  p?hl k?r thd s?xb s? te r?p to kh?xkkh?s? bb rwdr?w m?? phb che???x bkhth?re?y n? l? khx pord x?? khid ?? x?k?r c?b khx m? khw?m ke??ywk??xng  w?n n?? khu? d?? r?b k?r c?h?d s? te?y rxy d? th?? khlinik phe???x ch?wy ld k?r x?ks?eb th?? keid k?h??n k?b p?h???n s??ng ca ch?wy ld x?k?r kh?n la rem h??y d? k?h??n  rem t?ngt? w?n phrng n?? p?nt?n p? h??? r?b prath?n phe rd ni son thuk ch? phr?xm x?h??r t?m kh? nan? phe???x h??? krabwnk?r k???ng t?n d?nein t?x p?  khu? s??m?rt?h ch?? kh?l?m?n? h?r??x khr?m ben ? dril bb th? phe???x brrthe? x?k?r kh?n d?? khu? y?ng s??m?rt?h r?b pra th?n ben ? dril bb r?b prath?n d??  pord h?l?k le??yng k?r s??mp?h?s? k?b khw?m r?xn p?n wel? n?n  chn k?r x?b n?? xn c?d h?r??x xxk p? k???ng nxk ne???xngc?k x?c th?h??? p?h???n rakh?y khe??xng m?k k?h??n  pord n?d h?m?y phthy? prac? t?w k?hxng khu? x?k khr?ng la phthy? x?c trwc p?hiwh?n?ng k?hxng khu? x?k khr?ng   Today you are being treated for a rash that appears inflammatory, at this time no signs of infection, typically infectious rashes progressively gets worse in the 1-2 locations that this germ is present, typically can show redness, swelling, pain, fever and the skin is hot to touch, at times there is drainage  Rapid strep test is negative for bacteria to the throat, do not believe that sore throat is related  You have been given an injection of steroids today in the office today to help reduce the inflammatory process that occurs with this rash which will help minimize your itching as well as begin to clear  Starting tomorrow take prednisone  every morning with food as directed, to continue the above process  You may continue use of topical calamine or Benadryl cream to help manage itching, you may also continue oral Benadryl  Please avoid long exposures to heat such as a hot steamy shower or being outside as this may cause further irritation to your rash  Please keep upcoming appointment with your primary doctor and at that time they may recheck your skin

## 2024-03-09 ENCOUNTER — Ambulatory Visit

## 2024-03-09 ENCOUNTER — Other Ambulatory Visit: Payer: Self-pay

## 2024-03-09 VITALS — BP 140/82 | HR 65 | Ht 64.0 in | Wt 137.5 lb

## 2024-03-09 DIAGNOSIS — Z862 Personal history of diseases of the blood and blood-forming organs and certain disorders involving the immune mechanism: Secondary | ICD-10-CM

## 2024-03-09 DIAGNOSIS — L509 Urticaria, unspecified: Secondary | ICD-10-CM

## 2024-03-09 DIAGNOSIS — G4700 Insomnia, unspecified: Secondary | ICD-10-CM

## 2024-03-09 DIAGNOSIS — R42 Dizziness and giddiness: Secondary | ICD-10-CM

## 2024-03-09 NOTE — Progress Notes (Signed)
 New Patient Visit   Physician: Ozell Juhasz A Westyn Keatley, MD  Patient: Amber Austin   DOB: 12-06-1970   53 y.o. Female  MRN: [id number] Visit Date: 03/09/2024   Chief Complaint  Patient presents with   Establish Care   Subjective  Amber Austin is a 53 y.o. female who presents today as a new patient to establish care.   HPI  Discussed the use of AI scribe software for clinical note transcription with the patient, who gave verbal consent to proceed.  History of Present Illness     Patient underwent menopause at age 50.  History of infertility.  No family history of cancers.  She does have a family history of diabetes.  Apparently she was seen in [place] and diagnosed with thrombocytopenia.  Apparently etiology unknown.  She was also diagnosed with hypertension.  She was treated as well for hyperlipidemia.  Patient was given medications for both conditions but then has run out since she was seen in March  Urticaria She has had a cutaneous outbreak of pruritic macules with blistering over most of her body.  Exposure to specific inciting substance unknown.  She was given prednisone  and these have since resolved.  Not using any new soaps lotions or medications otherwise  Dizziness and orthostatic symptoms - Dizziness present for the past three weeks - Symptoms occur particularly when leaning over and standing up - No history of anemia or iron deficiency  Sleep disturbances - Significant difficulty with sleep, waking frequently at night - Sleep duration limited to three to four hours per night - Trial of melatonin and Benadryl without significant improvement - Sleep issues attributed in part to work schedule, post-menopausal disturbance  Cutaneous eruptions - History of rashes, recently more severe - Rashes characterized by blistering and itching - Recent prednisone  prescription at urgent care provided some relief - No new exposures to soaps or lotions  Thrombocytopenia -  History of low platelet count, most recently 60,000 during a visit to [place] - No blood work performed since returning to the [place]   Hyperlipidemia - History of high cholesterol - Completed a course of statin therapy in [place] - No follow-up since returning to the [place]          ASSESSMENT & PLAN  Encounter Diagnoses  Name Primary?   History of thrombocytopenia Yes   Urticaria    Positional lightheadedness    Insomnia, unspecified type     Orders Placed This Encounter  Procedures   CBC with Differential/Platelet   Comprehensive metabolic panel with GFR   Hemoglobin A1c   Lipid panel   Urinalysis, Routine w reflex microscopic   Iron, TIBC and Ferritin Panel    Assessment and Plan    Insomnia in postmenopausal female Chronic insomnia likely due to postmenopausal hormonal changes. - Consider trazodone or doxepin for sleep as needed. - Discussed sleep hygiene practices. - Emphasized sleep's importance for cardiovascular and cognitive health. - Discussed short-term sleep aid benefits.  Essential hypertension Blood pressure 140/82 mmHg, slightly elevated, no antihypertensive medication. - Obtain home blood pressure cuff. - Record 10-15 readings before next appointment. - Discussed regular monitoring importance.  Thrombocytopenia Platelet count 60,000, significantly low. - Order complete blood count.  Hypercholesterolemia Previously treated with statins, currently not on medication. - Order lipid panel.  Lightheadedness on standing, possible orthostatic hypotension or anemia Dizziness on standing, possible orthostatic hypotension or anemia. - Order blood tests including iron studies.  Recurrent pruritic rash with blisters Recurrent rash with blisters, partial  improvement with prednisone , possible allergic or autoimmune. - Consider dermatology referral if rash persists or worsens.  Improving currently   Have the patient follow-up in 4 weeks with  labs and blood pressure log.   Objective  BP (!) 140/82 (BP Location: Left Arm, Patient Position: Sitting, Cuff Size: Normal)   Pulse 65   Ht 5' 4 (1.626 m)   Wt 137 lb 8 oz (62.4 kg)   SpO2 96%   BMI 23.60 kg/m      Review of Systems  Constitutional:  Negative for chills, fever and weight loss.  Eyes:  Negative for blurred vision. h Respiratory:  Negative for cough and shortness of breath.   Cardiovascular:  Negative for chest pain and palpitations.  Skin:  Negative for rash.  Psychiatric/Behavioral:  Negative for depression. The patient is not nervous/anxious.      Physical Exam Physical Exam Vitals reviewed.  Constitutional:      Appearance: Normal appearance. Well-developed with normal weight.  HENT:     Head: Normocephalic and atraumatic.  Normal mucous membranes, no oral lesions Eyes:     Pupils: Pupils are equal, round, and reactive to light.  Neck:     Thyroid : No thyroid  mass or thyromegaly.  Cardiovascular:     Rate and Rhythm: Normal rate and regular rhythm. Normal heart sounds. Normal peripheral pulses Pulmonary:     Normal breath sounds with normal effort Abdominal:   Abdomen is soft, without tenderness or noted hepatosplenomegaly Musculoskeletal:        General: No swelling or edema  Lymphadenopathy:     Cervical: No cervical adenopathy.  Skin:    General: Skin is warm and dry without noticeable rash. Neurological:     General: No focal deficit present.  Psychiatric:        Mood and Affect: Mood, behavior and cognition normal   Past Medical History:  Diagnosis Date   Arthritis, rheumatoid (HCC)    Past Surgical History:  Procedure Laterality Date   gallstone     Family Status  Relation Name Status   Mother  Deceased   Father  Alive   Neg Hx  (Not Specified)  No partnership data on file   Family History  Problem Relation Age of Onset   Diabetes Mother    Hypertension Mother    Gout Father    Congestive Heart Failure Father     Breast cancer Neg Hx    Social History   Socioeconomic History   Marital status: Married    Spouse name: Not on file   Number of children: Not on file   Years of education: Not on file   Highest education level: Not on file  Occupational History   Not on file  Tobacco Use   Smoking status: Never   Smokeless tobacco: Never  Vaping Use   Vaping status: Never Used  Substance and Sexual Activity   Alcohol use: Never   Drug use: Never   Sexual activity: Yes    Birth control/protection: None, Post-menopausal  Other Topics Concern   Not on file  Social History Narrative   Not on file   Social Drivers of Health   Financial Resource Strain: Not on file  Food Insecurity: Not on file  Transportation Needs: Not on file  Physical Activity: Not on file  Stress: Not on file  Social Connections: Not on file   Outpatient Medications Prior to Visit  Medication Sig   predniSONE  (STERAPRED UNI-PAK 21 TAB) 10 MG (21) TBPK  tablet Take by mouth daily. Take 6 tabs by mouth daily  for 2 days, then 5 tabs for 2 days, then 4 tabs for 2 days, then 3 tabs for 2 days, 2 tabs for 2 days, then 1 tab by mouth daily for 2 days   [DISCONTINUED] letrozole  (FEMARA ) 2.5 MG tablet Take 2 tablets (5 mg total) by mouth daily. Take 1 tablet on Days 5-9 of cycle each month (only 5 days) (Patient not taking: Reported on 12/11/2019)   [DISCONTINUED] medroxyPROGESTERone  (PROVERA ) 10 MG tablet Take 1 tablet (10 mg total) by mouth daily. Use for ten days (Patient not taking: Reported on 12/11/2019)   [DISCONTINUED] Prenatal Vit-Fe Fumarate-FA (PRENATAL MULTIVITAMIN) TABS tablet Take 1 tablet by mouth daily at 12 noon.   [DISCONTINUED] PROGESTERONE , VAGINAL, 8 % GEL Place 1 applicator vaginally 2 (two) times daily. Use on Days 14-28 of your cycle each month. (Patient not taking: Reported on 12/11/2019)   No facility-administered medications prior to visit.   Allergies  Allergen Reactions   Tomato      There is no  immunization history on file for this patient.  Health Maintenance  Topic Date Due   HIV Screening  Never done   Hepatitis C Screening  Never done   DTaP/Tdap/Td (1 - Tdap) Never done   Hepatitis B Vaccines (1 of 3 - 19+ 3-dose series) Never done   Cervical Cancer Screening (HPV/Pap Cotest)  Never done   Colonoscopy  Never done   Pneumococcal Vaccine: 50+ Years (1 of 1 - PCV) Never done   MAMMOGRAM  01/23/2023   Zoster Vaccines- Shingrix (1 of 2) Never done   COVID-19 Vaccine (1 - 2024-25 season) Never done   INFLUENZA VACCINE  03/09/2024   HPV VACCINES  Aged Out   Meningococcal B Vaccine  Aged Out    Patient Care Team: Everlene Parris LABOR, MD as PCP - General (Family Medicine)  Depression Screen     No data to display           Parris LABOR Everlene, MD  [facility] [phone] (phone) [phone] (fax)  [facility]

## 2024-03-09 NOTE — Progress Notes (Signed)
 error

## 2024-04-04 ENCOUNTER — Other Ambulatory Visit

## 2024-04-04 DIAGNOSIS — Z862 Personal history of diseases of the blood and blood-forming organs and certain disorders involving the immune mechanism: Secondary | ICD-10-CM

## 2024-04-04 DIAGNOSIS — L509 Urticaria, unspecified: Secondary | ICD-10-CM | POA: Diagnosis not present

## 2024-04-04 DIAGNOSIS — R42 Dizziness and giddiness: Secondary | ICD-10-CM

## 2024-04-05 LAB — HEMOGLOBIN A1C
Hgb A1c MFr Bld: 5.9 % — ABNORMAL HIGH (ref <5.7)
Mean Plasma Glucose: 123 mg/dL
eAG (mmol/L): 6.8 mmol/L

## 2024-04-05 LAB — COMPREHENSIVE METABOLIC PANEL WITH GFR
AG Ratio: 1.7 (calc) (ref 1.0–2.5)
ALT: 22 U/L (ref 6–29)
AST: 13 U/L (ref 10–35)
Albumin: 4.7 g/dL (ref 3.6–5.1)
Alkaline phosphatase (APISO): 74 U/L (ref 37–153)
BUN: 14 mg/dL (ref 7–25)
CO2: 26 mmol/L (ref 20–32)
Calcium: 9.2 mg/dL (ref 8.6–10.4)
Chloride: 105 mmol/L (ref 98–110)
Creat: 0.53 mg/dL (ref 0.50–1.03)
Globulin: 2.7 g/dL (ref 1.9–3.7)
Glucose, Bld: 80 mg/dL (ref 65–99)
Potassium: 4 mmol/L (ref 3.5–5.3)
Sodium: 140 mmol/L (ref 135–146)
Total Bilirubin: 0.5 mg/dL (ref 0.2–1.2)
Total Protein: 7.4 g/dL (ref 6.1–8.1)
eGFR: 112 mL/min/1.73m2 (ref >=60)

## 2024-04-05 LAB — IRON,TIBC AND FERRITIN PANEL
%SAT: 14 % — ABNORMAL LOW (ref 16–45)
Ferritin: 59 ng/mL (ref 16–232)
Iron: 44 ug/dL — ABNORMAL LOW (ref 45–160)
TIBC: 306 ug/dL (ref 250–450)

## 2024-04-05 LAB — CBC WITH DIFFERENTIAL/PLATELET
Absolute Lymphocytes: 2254 {cells}/uL (ref 850–3900)
Absolute Monocytes: 533 {cells}/uL (ref 200–950)
Basophils Absolute: 29 {cells}/uL (ref 0–200)
Basophils Relative: 0.4 %
Eosinophils Absolute: 101 {cells}/uL (ref 15–500)
Eosinophils Relative: 1.4 %
HCT: 39.7 % (ref 35.0–45.0)
Hemoglobin: 12.5 g/dL (ref 11.7–15.5)
MCH: 28.2 pg (ref 27.0–33.0)
MCHC: 31.5 g/dL — ABNORMAL LOW (ref 32.0–36.0)
MCV: 89.4 fL (ref 80.0–100.0)
MPV: 11 fL (ref 7.5–12.5)
Monocytes Relative: 7.4 %
Neutro Abs: 4284 {cells}/uL (ref 1500–7800)
Neutrophils Relative %: 59.5 %
Platelets: 204 Thousand/uL (ref 140–400)
RBC: 4.44 Million/uL (ref 3.80–5.10)
RDW: 13.7 % (ref 11.0–15.0)
Total Lymphocyte: 31.3 %
WBC: 7.2 Thousand/uL (ref 3.8–10.8)

## 2024-04-05 LAB — URINALYSIS, ROUTINE W REFLEX MICROSCOPIC
Bacteria, UA: NONE SEEN /HPF
Bilirubin Urine: NEGATIVE
Glucose, UA: NEGATIVE
Hyaline Cast: NONE SEEN /LPF
Ketones, ur: NEGATIVE
Leukocytes,Ua: NEGATIVE
Nitrite: NEGATIVE
Protein, ur: NEGATIVE
RBC / HPF: NONE SEEN /HPF (ref 0–2)
Specific Gravity, Urine: 1.024 (ref 1.001–1.035)
WBC, UA: NONE SEEN /HPF (ref 0–5)
pH: 5.5 (ref 5.0–8.0)

## 2024-04-05 LAB — HEPATITIS B SURFACE ANTIGEN: Hepatitis B Surface Ag: NONREACTIVE

## 2024-04-05 LAB — LIPID PANEL
Cholesterol: 175 mg/dL (ref <200)
HDL: 57 mg/dL (ref >=50)
LDL Cholesterol (Calc): 100 mg/dL — ABNORMAL HIGH
Non-HDL Cholesterol (Calc): 118 mg/dL (ref <130)
Total CHOL/HDL Ratio: 3.1 (calc) (ref <5.0)
Triglycerides: 86 mg/dL (ref <150)

## 2024-04-05 LAB — HIV ANTIBODY (ROUTINE TESTING W REFLEX): HIV 1&2 Ab, 4th Generation: NONREACTIVE

## 2024-04-05 LAB — MICROSCOPIC MESSAGE

## 2024-04-18 ENCOUNTER — Ambulatory Visit (INDEPENDENT_AMBULATORY_CARE_PROVIDER_SITE_OTHER)

## 2024-04-18 VITALS — BP 154/90 | HR 80 | Ht 64.0 in | Wt 134.0 lb

## 2024-04-18 DIAGNOSIS — E611 Iron deficiency: Secondary | ICD-10-CM | POA: Diagnosis not present

## 2024-04-18 NOTE — Progress Notes (Signed)
 Progress Note  Physician: Quaid Yeakle A Ijanae Macapagal, MD   HPI: Amber Austin is a 53 y.o. female presenting on 04/18/2024 for Medical Management of Chronic Issues (Can't sleep at night) .  Discussed the use of AI scribe software for clinical note transcription with the patient, who gave verbal consent to proceed.  History of Present Illness        Amber Austin is a 53 year old female who presents with fatigue and sleep disturbances.  Fatigue and sleep disturbance - Fatigue present, attributed to late work shifts ending around 11 PM - Difficulty initiating sleep until 2 AM - Fragmented sleep from 2 AM to 6 AM - Poor sleep quality  Iron deficiency - Recent blood work revealed low iron levels - Inquired about appropriate dosage of iron supplements  Impaired glucose tolerance - Recent blood work indicated blood sugar levels in the prediabetic range - HgbA1C 5.7 - Maintains a healthy diet and exercises regularly - Family history of diabetes  Elevated blood pressure - Recent blood pressure reading of 154, attributed to stress from family problems - No home blood pressure cuff, but husband has one Results LABS Iron: decreased Platelets: within normal limits    Medical history:  Relevant past medical, surgical, family and social history reviewed and updated as indicated. Interim medical history since our last visit reviewed.  Allergies and medications reviewed and updated.   ROS: Negative unless specifically indicated above in HPI.   No current outpatient medications on file.       Objective:     BP (!) 154/90   Pulse 80   Ht 5' 4 (1.626 m)   Wt 134 lb (60.8 kg)   SpO2 100%   BMI 23.00 kg/m   Wt Readings from Last 3 Encounters:  04/18/24 134 lb (60.8 kg)  03/09/24 137 lb 8 oz (62.4 kg)  12/11/19 139 lb 14.4 oz (63.5 kg)    Physical Exam  Physical Exam Vitals reviewed.  Constitutional:      Appearance: Normal appearance. Well-developed  with normal weight.  Cardiovascular:     Rate and Rhythm: Normal rate and regular rhythm. Normal heart sounds. Normal peripheral pulses Pulmonary:     Normal breath sounds with normal effort Skin:    General: Skin is warm and dry without noticeable rash. Neurological:     General: No focal deficit present.  Psychiatric:        Mood and Affect: Mood, behavior and cognition normal      Assessment & Plan:  No diagnosis found.  No orders of the defined types were placed in this encounter.    Assessment and Plan    Iron deficiency Iron deficiency with low iron levels, causing fatigue. No current iron supplementation. Discussed importance of supplementation and potential need for re-evaluation if symptoms persist. Advised further evaluation if dizziness or lightheadedness occurs, as oral iron may not be well absorbed, potentially requiring IV iron. - Prescribe ferrous sulfate 325 mg with 65 mg elemental iron - Recheck blood work in February or March - Instruct to report symptoms of dizziness or lightheadedness  Elevated blood pressure Blood pressure elevated at 154 mmHg systolic, suspected stress-related. No home monitoring currently in place. - Advise obtaining a home blood pressure cuff - Instruct to monitor blood pressure at home aiming for 125/80 mmHg or less - Report if home readings are consistently high  Prediabetes Blood sugar levels in prediabetic range with family history of  diabetes. Discussed lifestyle modifications including diet and exercise. No immediate pharmacological intervention planned, but monitoring is advised. Emphasized risk of progression to diabetes with family history, but lifestyle changes can help manage risk. - Monitor blood sugar levels with next blood work in February - Encourage healthy diet and regular exercise        Recheck ;labs Feb - keep BP log at home

## 2024-05-24 ENCOUNTER — Ambulatory Visit: Admitting: Internal Medicine

## 2024-09-12 ENCOUNTER — Ambulatory Visit
